# Patient Record
Sex: Female | Born: 1995 | Race: Black or African American | Hispanic: No | Marital: Single | Smoking: Never smoker
Health system: Southern US, Community
[De-identification: ages and names within clinical notes are randomized; demographics above are authoritative.]

## PROBLEM LIST (undated history)

## (undated) DIAGNOSIS — N390 Urinary tract infection, site not specified: Secondary | ICD-10-CM

## (undated) DIAGNOSIS — B9689 Other specified bacterial agents as the cause of diseases classified elsewhere: Secondary | ICD-10-CM

## (undated) DIAGNOSIS — B379 Candidiasis, unspecified: Secondary | ICD-10-CM

## (undated) HISTORY — PX: LYMPH NODE DISSECTION: SHX5087

## (undated) HISTORY — PX: BREAST SURGERY: SHX581

---

## 2016-01-08 ENCOUNTER — Emergency Department (HOSPITAL_COMMUNITY): Payer: Medicaid Other

## 2016-01-08 ENCOUNTER — Encounter (HOSPITAL_COMMUNITY): Payer: Self-pay

## 2016-01-08 ENCOUNTER — Emergency Department (HOSPITAL_COMMUNITY)
Admission: EM | Admit: 2016-01-08 | Discharge: 2016-01-08 | Disposition: A | Discharge status: Home / Self Care | Payer: Medicaid Other | Attending: Emergency Medicine | Admitting: Emergency Medicine

## 2016-01-08 DIAGNOSIS — R079 Chest pain, unspecified: Secondary | ICD-10-CM | POA: Insufficient documentation

## 2016-01-08 DIAGNOSIS — R002 Palpitations: Secondary | ICD-10-CM | POA: Insufficient documentation

## 2016-01-08 LAB — CBC
HCT: 39.7 % (ref 36.0–46.0)
HEMOGLOBIN: 13.2 g/dL (ref 12.0–15.0)
MCH: 26.7 pg (ref 26.0–34.0)
MCHC: 33.2 g/dL (ref 30.0–36.0)
MCV: 80.2 fL (ref 78.0–100.0)
Platelets: 300 10*3/uL (ref 150–400)
RBC: 4.95 MIL/uL (ref 3.87–5.11)
RDW: 13.8 % (ref 11.5–15.5)
WBC: 7.6 10*3/uL (ref 4.0–10.5)

## 2016-01-08 LAB — I-STAT TROPONIN, ED: TROPONIN I, POC: 0 ng/mL (ref 0.00–0.08)

## 2016-01-08 LAB — BASIC METABOLIC PANEL
ANION GAP: 14 (ref 5–15)
BUN: 12 mg/dL (ref 6–20)
CALCIUM: 9.5 mg/dL (ref 8.9–10.3)
CO2: 23 mmol/L (ref 22–32)
Chloride: 100 mmol/L — ABNORMAL LOW (ref 101–111)
Creatinine, Ser: 0.92 mg/dL (ref 0.44–1.00)
GFR calc Af Amer: >60 mL/min (ref >60)
GLUCOSE: 113 mg/dL — AB (ref 65–99)
Potassium: 3.9 mmol/L (ref 3.5–5.1)
SODIUM: 137 mmol/L (ref 135–145)

## 2016-01-08 LAB — MAGNESIUM: Magnesium: 2 mg/dL (ref 1.7–2.4)

## 2016-01-08 NOTE — ED Notes (Signed)
PER PTAR: pt brought in via ems for chest pain and heart palpitations. She felt as though her heart was beating very fast. HR was 112 with EMS. BP- 150/70. CP started this morning around 12am and had one episode of emesis.

## 2016-01-08 NOTE — ED Provider Notes (Signed)
CSN: [id number]     Arrival date & time 01/08/16  0114 History   First MD Initiated Contact with Patient 01/08/16 0415     Chief Complaint  Patient presents with  . Chest Pain     (Consider location/radiation/quality/duration/timing/severity/associated sxs/prior Treatment) HPI Joyce Hernandez is a 20 y.o. female with no significant past medical history presenting today for chest pain. She states she was studying this morning around midnight. She is drinking coffee. She had sudden onset of chest pain and palpitations. She states she fell is beating very fast. She took a shower and when EMS arrived they state that there is nothing makes her due for her. She then went to bed but he came back in the middle the night. She then called EMS and she was transported here. She denies his having in the past. She denies excessive caffeine use. She had no chest shortness of breath, vomiting, or sweating at the time. Her symptoms have all resolved and she is in the waiting room. There are no further complaints.  10 Systems reviewed and are negative for acute change except as noted in the HPI.     History reviewed. No pertinent past medical history. Past Surgical History  Procedure Laterality Date  . Breast surgery      lymph node removal   No family history on file. Social History  Substance Use Topics  . Smoking status: Never Smoker   . Smokeless tobacco: None  . Alcohol Use: No   OB History    No data available     Review of Systems    Allergies  Review of patient's allergies indicates not on file.  Home Medications   Prior to Admission medications   Not on File   BP 131/81 mmHg  Pulse 94  Temp(Src) 99 F (37.2 C) (Oral)  Resp 16  SpO2 100%  LMP 01/08/2016 Physical Exam  Constitutional: She is oriented to person, place, and time. She appears well-developed and well-nourished. No distress.  HENT:  Head: Normocephalic and atraumatic.  Nose: Nose normal.  Mouth/Throat:  Oropharynx is clear and moist. No oropharyngeal exudate.  Eyes: Conjunctivae and EOM are normal. Pupils are equal, round, and reactive to light. No scleral icterus.  Neck: Normal range of motion. Neck supple. No JVD present. No tracheal deviation present. No thyromegaly present.  Cardiovascular: Normal rate, regular rhythm and normal heart sounds.  Exam reveals no gallop and no friction rub.   No murmur heard. Pulmonary/Chest: Effort normal and breath sounds normal. No respiratory distress. She has no wheezes. She exhibits no tenderness.  Abdominal: Soft. Bowel sounds are normal. She exhibits no distension and no mass. There is no tenderness. There is no rebound and no guarding.  Musculoskeletal: Normal range of motion. She exhibits no edema or tenderness.  Lymphadenopathy:    She has no cervical adenopathy.  Neurological: She is alert and oriented to person, place, and time. No cranial nerve deficit. She exhibits normal muscle tone.  Skin: Skin is warm and dry. No rash noted. No erythema. No pallor.  Nursing note and vitals reviewed.   ED Course  Procedures (including critical care time) Labs Review Labs Reviewed  BASIC METABOLIC PANEL - Abnormal; Notable for the following:    Chloride 100 (*)    Glucose, Bld 113 (*)    All other components within normal limits  CBC  MAGNESIUM  I-STAT TROPOININ, ED    Imaging Review Dg Chest 2 View  01/08/2016  CLINICAL DATA:  Left-sided  chest burning and increased heart rate tonight. Nonsmoker. EXAM: CHEST  2 VIEW COMPARISON:  None. FINDINGS: The heart size and mediastinal contours are within normal limits. Both lungs are clear. The visualized skeletal structures are unremarkable. IMPRESSION: No active cardiopulmonary disease. Electronically Signed   By: Burman NievesWilliam  Stevens M.D.   On: 01/08/2016 02:33   I have personally reviewed and evaluated these images and lab results as part of my medical decision-making.   EKG Interpretation   Date/Time:   Friday January 08 2016 01:28:31 EDT Ventricular Rate:  92 PR Interval:  138 QRS Duration: 80 QT Interval:  344 QTC Calculation: 425 R Axis:   82 Text Interpretation:  Normal sinus rhythm with sinus arrhythmia Normal ECG  No old tracing to compare Confirmed by Erroll Lunani, Jevan Gaunt Ayokunle [phone]([id number]) on  01/08/2016 4:17:36 AM      MDM   Final diagnoses:  None    Patient presents to the emergency department for palpitations. Her symptoms have all resolved. EKG is normal. Troponin was not clinically indicated but obtained by triage and is negative. She currently has no further symptoms. We'll excise are within normal limits. She appears well in no acute distress, vital signs remain within her normal limits and she is safe for discharge.    Tomasita CrumbleAdeleke Billal Rollo, MD 01/08/16 [phone]

## 2016-01-08 NOTE — Discharge Instructions (Signed)
Palpitations Joyce Hernandez, her blood work, EKG, and chest x-ray of your heart were all normal. See a primary care physician within 3 days for close follow-up. Do not use excessive caffeine to prevent further palpitations. For any worsening symptoms, come back to emergency department immediately. Thank you. A palpitation is the feeling that your heartbeat is irregular. It may feel like your heart is fluttering or skipping a beat. It may also feel like your heart is beating faster than normal. This is usually not a serious problem. In some cases, you may need more medical tests. HOME CARE  Avoid:  Caffeine in coffee, tea, soft drinks, diet pills, and energy drinks.  Chocolate.  Alcohol.  Stop smoking if you smoke.  Reduce your stress and anxiety. Try:  A method that measures bodily functions so you can learn to control them (biofeedback).  Yoga.  Meditation.  Physical activity such as swimming, jogging, or walking.  Get plenty of rest and sleep. GET HELP IF:  Your fast or irregular heartbeat continues after 24 hours.  Your palpitations occur more often. GET HELP RIGHT AWAY IF:   You have chest pain.  You feel short of breath.  You have a very bad headache.  You feel dizzy or pass out (faint). MAKE SURE YOU:   Understand these instructions.  Will watch your condition.  Will get help right away if you are not doing well or get worse.   This information is not intended to replace advice given to you by your health care provider. Make sure you discuss any questions you have with your health care provider.   Document Released: 06/14/2008 Document Revised: 09/26/2014 Document Reviewed: 11/04/2011 Elsevier Interactive Patient Education [employer]2016 Elsevier Inc.

## 2016-01-19 ENCOUNTER — Encounter (HOSPITAL_COMMUNITY): Payer: Self-pay | Admitting: Emergency Medicine

## 2016-01-19 ENCOUNTER — Emergency Department (HOSPITAL_COMMUNITY)
Admission: EM | Admit: 2016-01-19 | Discharge: 2016-01-19 | Disposition: A | Discharge status: Home / Self Care | Payer: Medicaid Other | Attending: Emergency Medicine | Admitting: Emergency Medicine

## 2016-01-19 DIAGNOSIS — Z3202 Encounter for pregnancy test, result negative: Secondary | ICD-10-CM | POA: Diagnosis not present

## 2016-01-19 DIAGNOSIS — N939 Abnormal uterine and vaginal bleeding, unspecified: Secondary | ICD-10-CM

## 2016-01-19 LAB — CBC WITH DIFFERENTIAL/PLATELET
BASOS ABS: 0.1 10*3/uL (ref 0.0–0.1)
BASOS PCT: 1 %
EOS ABS: 0.2 10*3/uL (ref 0.0–0.7)
EOS PCT: 2 %
HCT: 42.8 % (ref 36.0–46.0)
Hemoglobin: 14.3 g/dL (ref 12.0–15.0)
LYMPHS PCT: 41 %
Lymphs Abs: 2.9 10*3/uL (ref 0.7–4.0)
MCH: 26.8 pg (ref 26.0–34.0)
MCHC: 33.4 g/dL (ref 30.0–36.0)
MCV: 80.3 fL (ref 78.0–100.0)
Monocytes Absolute: 0.5 10*3/uL (ref 0.1–1.0)
Monocytes Relative: 7 %
Neutro Abs: 3.5 10*3/uL (ref 1.7–7.7)
Neutrophils Relative %: 49 %
PLATELETS: 386 10*3/uL (ref 150–400)
RBC: 5.33 MIL/uL — AB (ref 3.87–5.11)
RDW: 14.1 % (ref 11.5–15.5)
WBC: 7 10*3/uL (ref 4.0–10.5)

## 2016-01-19 LAB — WET PREP, GENITAL
CLUE CELLS WET PREP: NONE SEEN
SPERM: NONE SEEN
Trich, Wet Prep: NONE SEEN
Yeast Wet Prep HPF POC: NONE SEEN

## 2016-01-19 LAB — BASIC METABOLIC PANEL
ANION GAP: 9 (ref 5–15)
BUN: 13 mg/dL (ref 6–20)
CALCIUM: 9.6 mg/dL (ref 8.9–10.3)
CO2: 27 mmol/L (ref 22–32)
Chloride: 106 mmol/L (ref 101–111)
Creatinine, Ser: 0.82 mg/dL (ref 0.44–1.00)
GFR calc Af Amer: >60 mL/min (ref >60)
Glucose, Bld: 77 mg/dL (ref 65–99)
POTASSIUM: 3.8 mmol/L (ref 3.5–5.1)
SODIUM: 142 mmol/L (ref 135–145)

## 2016-01-19 LAB — POC URINE PREG, ED: PREG TEST UR: NEGATIVE

## 2016-01-19 NOTE — Discharge Instructions (Signed)
Your labs today were unremarkable. Please call [facility] to schedule an appointment for further evaluation. Return to the ER for new or worsening symptoms.   Abnormal Uterine Bleeding Abnormal uterine bleeding can affect women at various stages in life, including teenagers, women in their reproductive years, pregnant women, and women who have reached menopause. Several kinds of uterine bleeding are considered abnormal, including:  Bleeding or spotting between periods.   Bleeding after sexual intercourse.   Bleeding that is heavier or more than normal.   Periods that last longer than usual.  Bleeding after menopause.  Many cases of abnormal uterine bleeding are minor and simple to treat, while others are more serious. Any type of abnormal bleeding should be evaluated by your health care provider. Treatment will depend on the cause of the bleeding. HOME CARE INSTRUCTIONS Monitor your condition for any changes. The following actions may help to alleviate any discomfort you are experiencing:  Avoid the use of tampons and douches as directed by your health care provider.  Change your pads frequently. You should get regular pelvic exams and Pap tests. Keep all follow-up appointments for diagnostic tests as directed by your health care provider.  SEEK MEDICAL CARE IF:   Your bleeding lasts more than 1 week.   You feel dizzy at times.  SEEK IMMEDIATE MEDICAL CARE IF:   You pass out.   You are changing pads every 15 to 30 minutes.   You have abdominal pain.  You have a fever.   You become sweaty or weak.   You are passing large blood clots from the vagina.   You start to feel nauseous and vomit. MAKE SURE YOU:   Understand these instructions.  Will watch your condition.  Will get help right away if you are not doing well or get worse.   This information is not intended to replace advice given to you by your health care provider. Make sure you  discuss any questions you have with your health care provider.   Document Released: 09/05/2005 Document Revised: 09/10/2013 Document Reviewed: 04/04/2013 Elsevier Interactive Patient Education 2016 Elsevier Inc.  Dysfunctional Uterine Bleeding Dysfunctional uterine bleeding is abnormal bleeding from the uterus. Dysfunctional uterine bleeding includes:  A period that comes earlier or later than usual.  A period that is lighter, heavier, or has blood clots.  Bleeding between periods.  Skipping one or more periods.  Bleeding after sexual intercourse.  Bleeding after menopause. HOME CARE INSTRUCTIONS  Pay attention to any changes in your symptoms. Follow these instructions to help with your condition: Eating  Eat well-balanced meals. Include foods that are high in iron, such as liver, meat, shellfish, green leafy vegetables, and eggs.  If you become constipated:  Drink plenty of water.  Eat fruits and vegetables that are high in water and fiber, such as spinach, carrots, raspberries, apples, and mango. Medicines  Take over-the-counter and prescription medicines only as told by your health care provider.  Do not change medicines without talking with your health care provider.  Aspirin or medicines that contain aspirin may make the bleeding worse. Do not take those medicines:  During the week before your period.  During your period.  If you were prescribed iron pills, take them as told by your health care provider. Iron pills help to replace iron that your body loses because of this condition. Activity  If you need to change your sanitary pad or tampon more than one time every 2 hours:  Lie in bed  with your feet raised (elevated).  Place a cold pack on your lower abdomen.  Rest as much as possible until the bleeding stops or slows down.  Do not try to lose weight until the bleeding has stopped and your blood iron level is back to normal. Other Instructions  For two  months, write down:  When your period starts.  When your period ends.  When any abnormal bleeding occurs.  What problems you notice.  Keep all follow up visits as told by your health care provider. This is important. SEEK MEDICAL CARE IF:  You get light-headed or weak.  You have nausea and vomiting.  You cannot eat or drink without vomiting.  You feel dizzy or have diarrhea while you are taking medicines.  You are taking birth control pills or hormones, and you want to change them or stop taking them. SEEK IMMEDIATE MEDICAL CARE IF:  You develop a fever or chills.  You need to change your sanitary pad or tampon more than one time per hour.  Your bleeding becomes heavier, or your flow contains clots more often.  You develop pain in your abdomen.  You lose consciousness.  You develop a rash.   This information is not intended to replace advice given to you by your health care provider. Make sure you discuss any questions you have with your health care provider.   Document Released: 09/02/2000 Document Revised: 05/27/2015 Document Reviewed: 12/01/2014 Elsevier Interactive Patient Education [employer]2016 Elsevier Inc.

## 2016-01-19 NOTE — ED Notes (Signed)
PA at bedside.

## 2016-01-19 NOTE — Progress Notes (Signed)
Medicaid [place] access response hx indicates [facility] [address] [place], [place] [id number] [phone]

## 2016-01-19 NOTE — ED Provider Notes (Signed)
CSN: [id number]     Arrival date & time 01/19/16  1552 History   First MD Initiated Contact with Patient 01/19/16 1647     Chief Complaint  Patient presents with  . Vaginal Bleeding    HPI  Joyce Hernandez is an 20 y.o. female with no significant PMH who presents to the ED for evaluation of vaginal bleeding. She states she got her period on time at the beginning of April. She states that since then she has continued to have daily bleeding filling two maxi pads a day. She denies abdominal pain, n/v/d, dysuria, urinary frequency/urgency, or other vaginal discharge. She states that she had her last depo shot in August 2016 and has not been on any birth control since then. She states that until this past month her periods were regular and not heavy. She denies feeling faint or lightheaded. She does not have an OBGYN.   History reviewed. No pertinent past medical history. Past Surgical History  Procedure Laterality Date  . Breast surgery      lymph node removal   History reviewed. No pertinent family history. Social History  Substance Use Topics  . Smoking status: Never Smoker   . Smokeless tobacco: None  . Alcohol Use: No   OB History    No data available     Review of Systems  All other systems reviewed and are negative.     Allergies  Review of patient's allergies indicates no known allergies.  Home Medications   Prior to Admission medications   Medication Sig Start Date End Date Taking? Authorizing Provider  acetaminophen (TYLENOL) 500 MG tablet Take 500 mg by mouth 2 (two) times daily as needed for moderate pain or headache.   Yes Historical Provider, MD  Pseudoeph-Doxylamine-DM-APAP (NYQUIL PO) Take 1 Dose by mouth daily as needed (cold symptoms).   Yes Historical Provider, MD   BP 131/79 mmHg  Pulse 100  Temp(Src) 98.8 F (37.1 C) (Oral)  Resp 18  SpO2 100%  LMP 01/08/2016 Physical Exam  Constitutional: She is oriented to person, place, and time.  HENT:  Right  Ear: External ear normal.  Left Ear: External ear normal.  Nose: Nose normal.  Mouth/Throat: Oropharynx is clear and moist. No oropharyngeal exudate.  Eyes: Conjunctivae and EOM are normal. Pupils are equal, round, and reactive to light.  Neck: Normal range of motion. Neck supple.  Cardiovascular: Normal rate, regular rhythm, normal heart sounds and intact distal pulses.   Pulmonary/Chest: Effort normal and breath sounds normal. No respiratory distress. She has no wheezes. She exhibits no tenderness.  Abdominal: Soft. Bowel sounds are normal. She exhibits no distension. There is no tenderness.  Genitourinary:  Scant blood in vault. No masses. Cervix unremarkable. No CMT or adnexal tenderness.   Musculoskeletal: She exhibits no edema.  Neurological: She is alert and oriented to person, place, and time. No cranial nerve deficit.  Skin: Skin is warm and dry.  Psychiatric: She has a normal mood and affect.  Nursing note and vitals reviewed.  Filed Vitals:   01/19/16 1559 01/19/16 1810  BP: 131/79 107/70  Pulse: 100 64  Temp: 98.8 F (37.1 C)   TempSrc: Oral   Resp: 18 16  SpO2: 100% 100%     ED Course  Procedures (including critical care time) Labs Review Labs Reviewed  WET PREP, GENITAL - Abnormal; Notable for the following:    WBC, Wet Prep HPF POC MODERATE (*)    All other components within normal limits  CBC WITH DIFFERENTIAL/PLATELET - Abnormal; Notable for the following:    RBC 5.33 (*)    All other components within normal limits  BASIC METABOLIC PANEL  POC URINE PREG, ED  GC/CHLAMYDIA PROBE AMP ([place]) NOT AT [facility]    Imaging Review No results found. [facility] personally reviewed and evaluated these images and lab results as part of my medical decision-making.   EKG Interpretation None      MDM   Final diagnoses:  Abnormal uterine bleeding    Labs unremarkable. No anemia. Hemodynamically stable. No heavy bleeding on GU exam. No indication for further  emergent workup at this time. Discussed with pt there are many possible etiologies to her abnormal uterine bleeding. Instructed f/u at [facility] outpatient clinic. ER return precautions given.    Carlene Coria, PA-C 01/19/16 1830  Richardean Canal, MD 01/19/16 2225

## 2016-01-19 NOTE — ED Notes (Signed)
Pt states that she has been vaginally bleeding x 1 month. States that she had her depo shot last August but not since then. Alert and oriented.

## 2016-01-20 LAB — GC/CHLAMYDIA PROBE AMP (~~LOC~~) NOT AT ARMC
Chlamydia: NEGATIVE
Neisseria Gonorrhea: NEGATIVE

## 2016-09-25 ENCOUNTER — Ambulatory Visit (HOSPITAL_COMMUNITY)
Admission: EM | Admit: 2016-09-25 | Discharge: 2016-09-25 | Disposition: A | Discharge status: Home / Self Care | Payer: Medicaid Other | Attending: Family Medicine | Admitting: Family Medicine

## 2016-09-25 ENCOUNTER — Encounter (HOSPITAL_COMMUNITY): Payer: Self-pay | Admitting: Emergency Medicine

## 2016-09-25 DIAGNOSIS — J019 Acute sinusitis, unspecified: Secondary | ICD-10-CM | POA: Diagnosis not present

## 2016-09-25 MED ORDER — AMOXICILLIN 500 MG PO CAPS: 500.0000 mg | ORAL_CAPSULE | Freq: Three times a day (TID) | ORAL | 0 refills | Status: DC

## 2016-09-25 NOTE — ED Provider Notes (Signed)
CSN: [id number]     Arrival date & time 09/25/16  1351 History   First MD Initiated Contact with Patient 09/25/16 1456     Chief Complaint  Patient presents with  . URI   (Consider location/radiation/quality/duration/timing/severity/associated sxs/prior Treatment) HPI Joyce Hernandez is a 21 y.o. female presenting to UC with c/o subjective fever, generalized and frontal headaches, sore throat, nasal congestion, cough, and bilateral ear pain that started 5 days ago.  She has taken OTC cough/cold medication with mild temporary relief.  Denies sick contacts or recent travel. Denies n/v/d.    History reviewed. No pertinent past medical history. Past Surgical History:  Procedure Laterality Date  . BREAST SURGERY     lymph node removal   No family history on file. Social History  Substance Use Topics  . Smoking status: Never Smoker  . Smokeless tobacco: Never Used  . Alcohol use No   OB History    No data available     Review of Systems  Constitutional: Positive for fever. Negative for chills.  HENT: Positive for congestion, ear pain, rhinorrhea and sore throat. Negative for trouble swallowing and voice change.   Respiratory: Positive for cough. Negative for shortness of breath.   Cardiovascular: Negative for chest pain and palpitations.  Gastrointestinal: Negative for abdominal pain, diarrhea, nausea and vomiting.  Musculoskeletal: Positive for arthralgias and myalgias. Negative for back pain.  Skin: Negative for rash.  Neurological: Positive for headaches. Negative for dizziness and light-headedness.    Allergies  Patient has no known allergies.  Home Medications   Prior to Admission medications   Medication Sig Start Date End Date Taking? Authorizing Provider  acetaminophen (TYLENOL) 500 MG tablet Take 500 mg by mouth 2 (two) times daily as needed for moderate pain or headache.    Historical Provider, MD  amoxicillin (AMOXIL) 500 MG capsule Take 1 capsule (500 mg total)  by mouth 3 (three) times daily. 09/25/16   Junius FinnerErin O'Malley, PA-C  Pseudoeph-Doxylamine-DM-APAP (NYQUIL PO) Take 1 Dose by mouth daily as needed (cold symptoms).    Historical Provider, MD   Meds Ordered and Administered this Visit  Medications - No data to display  BP 140/85 (BP Location: Right Arm)   Pulse 97   Temp 99.6 F (37.6 C) (Oral)   Resp 16   Ht 5' (1.524 m)   Wt 150 lb (68 kg)   SpO2 97%   BMI 29.29 kg/m  No data found.   Physical Exam  Constitutional: She is oriented to person, place, and time. She appears well-developed and well-nourished. No distress.  HENT:  Head: Normocephalic and atraumatic.  Right Ear: Tympanic membrane normal.  Left Ear: Tympanic membrane normal.  Nose: Mucosal edema present. Right sinus exhibits maxillary sinus tenderness and frontal sinus tenderness. Left sinus exhibits maxillary sinus tenderness and frontal sinus tenderness.  Mouth/Throat: Uvula is midline and mucous membranes are normal. Posterior oropharyngeal erythema present. No oropharyngeal exudate, posterior oropharyngeal edema or tonsillar abscesses.  Eyes: EOM are normal.  Neck: Normal range of motion. Neck supple.  Cardiovascular: Normal rate and regular rhythm.   Pulmonary/Chest: Effort normal and breath sounds normal. No stridor. No respiratory distress. She has no wheezes. She has no rales.  Musculoskeletal: Normal range of motion.  Lymphadenopathy:    She has no cervical adenopathy.  Neurological: She is alert and oriented to person, place, and time.  Skin: Skin is warm and dry. She is not diaphoretic.  Psychiatric: She has a normal mood and affect. Her behavior  is normal.  Nursing note and vitals reviewed.   Urgent Care Course   Clinical Course     Procedures (including critical care time)  Labs Review Labs Reviewed - No data to display  Imaging Review No results found.    MDM   1. Acute rhinosinusitis    Pt c/o 5 days of URI and sinus symptoms, gradually  worsening. Fever and sinus pain. Will cover for bacterial cause given sinus tenderness and reported persistent fever. Rx: Amoxicillin  F/u with PCP in 1 week if not improving.    Junius Finner, PA-C 09/25/16 1805

## 2016-09-25 NOTE — ED Triage Notes (Signed)
PT reports fever, headaches, body aches., sore throat, cough, and ear pain that started Wednesday

## 2017-05-24 ENCOUNTER — Encounter (HOSPITAL_COMMUNITY): Payer: Self-pay | Admitting: Emergency Medicine

## 2017-05-24 DIAGNOSIS — R109 Unspecified abdominal pain: Secondary | ICD-10-CM | POA: Diagnosis not present

## 2017-05-24 DIAGNOSIS — R51 Headache: Secondary | ICD-10-CM | POA: Insufficient documentation

## 2017-05-24 DIAGNOSIS — R42 Dizziness and giddiness: Secondary | ICD-10-CM | POA: Insufficient documentation

## 2017-05-24 DIAGNOSIS — Z5321 Procedure and treatment not carried out due to patient leaving prior to being seen by health care provider: Secondary | ICD-10-CM | POA: Diagnosis not present

## 2017-05-24 LAB — URINALYSIS, ROUTINE W REFLEX MICROSCOPIC
BILIRUBIN URINE: NEGATIVE
Glucose, UA: NEGATIVE mg/dL
HGB URINE DIPSTICK: NEGATIVE
Ketones, ur: NEGATIVE mg/dL
Leukocytes, UA: NEGATIVE
Nitrite: NEGATIVE
PH: 5 (ref 5.0–8.0)
Protein, ur: NEGATIVE mg/dL
SPECIFIC GRAVITY, URINE: 1.024 (ref 1.005–1.030)

## 2017-05-24 LAB — BASIC METABOLIC PANEL
ANION GAP: 8 (ref 5–15)
BUN: 11 mg/dL (ref 6–20)
CHLORIDE: 105 mmol/L (ref 101–111)
CO2: 25 mmol/L (ref 22–32)
Calcium: 9.6 mg/dL (ref 8.9–10.3)
Creatinine, Ser: 0.84 mg/dL (ref 0.44–1.00)
GFR calc Af Amer: >60 mL/min (ref >60)
GFR calc non Af Amer: >60 mL/min (ref >60)
GLUCOSE: 86 mg/dL (ref 65–99)
POTASSIUM: 4.2 mmol/L (ref 3.5–5.1)
Sodium: 138 mmol/L (ref 135–145)

## 2017-05-24 LAB — CBC
HEMATOCRIT: 40.6 % (ref 36.0–46.0)
HEMOGLOBIN: 13.5 g/dL (ref 12.0–15.0)
MCH: 26.7 pg (ref 26.0–34.0)
MCHC: 33.3 g/dL (ref 30.0–36.0)
MCV: 80.2 fL (ref 78.0–100.0)
Platelets: 335 10*3/uL (ref 150–400)
RBC: 5.06 MIL/uL (ref 3.87–5.11)
RDW: 13.6 % (ref 11.5–15.5)
WBC: 7.7 10*3/uL (ref 4.0–10.5)

## 2017-05-24 LAB — I-STAT BETA HCG BLOOD, ED (MC, WL, AP ONLY): I-stat hCG, quantitative: <5 m[IU]/mL (ref <5)

## 2017-05-24 LAB — CBG MONITORING, ED: Glucose-Capillary: 62 mg/dL — ABNORMAL LOW (ref 65–99)

## 2017-05-24 NOTE — ED Notes (Addendum)
Pt has multiple complaints, reports dizziness that started while at work. Also reports abd pain and HA a few days ago.

## 2017-05-24 NOTE — ED Notes (Signed)
Pt states she is leaving ED due to the 7hr wait time.

## 2017-05-25 ENCOUNTER — Emergency Department (HOSPITAL_COMMUNITY)
Admission: EM | Admit: 2017-05-25 | Discharge: 2017-05-25 | Disposition: A | Discharge status: Home / Self Care | Payer: Medicaid Other | Attending: Emergency Medicine | Admitting: Emergency Medicine

## 2017-08-30 ENCOUNTER — Other Ambulatory Visit: Payer: Self-pay

## 2017-08-30 ENCOUNTER — Ambulatory Visit (HOSPITAL_COMMUNITY)
Admission: EM | Admit: 2017-08-30 | Discharge: 2017-08-30 | Disposition: A | Discharge status: Home / Self Care | Payer: Medicaid Other | Attending: Family Medicine | Admitting: Family Medicine

## 2017-08-30 ENCOUNTER — Encounter (HOSPITAL_COMMUNITY): Payer: Self-pay | Admitting: Emergency Medicine

## 2017-08-30 DIAGNOSIS — N760 Acute vaginitis: Secondary | ICD-10-CM | POA: Insufficient documentation

## 2017-08-30 DIAGNOSIS — N390 Urinary tract infection, site not specified: Secondary | ICD-10-CM | POA: Diagnosis present

## 2017-08-30 LAB — POCT PREGNANCY, URINE: PREG TEST UR: NEGATIVE

## 2017-08-30 MED ORDER — METRONIDAZOLE 500 MG PO TABS: 500.0000 mg | ORAL_TABLET | Freq: Two times a day (BID) | ORAL | 0 refills | Status: AC

## 2017-08-30 NOTE — Discharge Instructions (Addendum)
Complete course of medication for bacterial vaginosis, we will notify you of any positive findings from your vaginal test.  Withhold from intercourse until medication complete.  If symptoms worsen or do not improve in the next week to return to be seen or to follow up with PCP.

## 2017-08-30 NOTE — ED Provider Notes (Addendum)
[facility]    CSN: [id number] Arrival date & time: 08/30/17  1008     History   Chief Complaint Chief Complaint  Patient presents with  . Urinary Tract Infection    HPI Joyce Hernandez is a 21 y.o. female.   Joyce SohoZhanaysha presents with complaints of malodorous vaginal discharge which she has had for approximately 1 week. She states it is white discharge. Denies any vaginal itching. She states she was treated for UTI, 11/23- 11/30. She states all urinary symptoms have since resolved, denies dysuria, frequency or urgency. LMP 12/3. She is sexually active with one partner and does use condom. Denies known exposure to STDs. Denies abdominal pain, back pain, fevers, or previous similar symptoms.    ROS per HPI.       History reviewed. No pertinent past medical history.  There are no active problems to display for this patient.   Past Surgical History:  Procedure Laterality Date  . BREAST SURGERY     lymph node removal    OB History    No data available       Home Medications    Prior to Admission medications   Medication Sig Start Date End Date Taking? Authorizing Provider  acetaminophen (TYLENOL) 500 MG tablet Take 500 mg by mouth 2 (two) times daily as needed for moderate pain or headache.    [provider]  metroNIDAZOLE (FLAGYL) 500 MG tablet Take 1 tablet (500 mg total) by mouth 2 (two) times daily for 7 days. 08/30/17 09/06/17  Georgetta HaberBurky, Natalie B, NP    Family History Family History  Problem Relation Age of Onset  . Healthy Mother     Social History Social History   Tobacco Use  . Smoking status: Never Smoker  . Smokeless tobacco: Never Used  Substance Use Topics  . Alcohol use: No  . Drug use: No     Allergies   Patient has no known allergies.   Review of Systems Review of Systems   Physical Exam Triage Vital Signs ED Triage Vitals [08/30/17 1049]  Enc Vitals Group     BP 115/73     Pulse Rate 74     Resp 18    Temp 98.9 F (37.2 C)     Temp Source Oral     SpO2 100 %     Weight      Height      Head Circumference      Peak Flow      Pain Score      Pain Loc      Pain Edu?      Excl. in GC?    No data found.  Updated Vital Signs BP 115/73 (BP Location: Right Arm)   Pulse 74   Temp 98.9 F (37.2 C) (Oral)   Resp 18   LMP 08/21/2017   SpO2 100%   Visual Acuity Right Eye Distance:   Left Eye Distance:   Bilateral Distance:    Right Eye Near:   Left Eye Near:    Bilateral Near:     Physical Exam  Constitutional: She is oriented to person, place, and time. She appears well-developed and well-nourished. No distress.  Cardiovascular: Normal rate, regular rhythm and normal heart sounds.  Pulmonary/Chest: Effort normal and breath sounds normal.  Abdominal: Soft. There is no tenderness. There is no CVA tenderness.  Genitourinary:  Genitourinary Comments: Pelvic exam deferred at this time, patient self- collected vaginal swab   Neurological: She is alert  and oriented to person, place, and time.  Skin: Skin is warm and dry.     UC Treatments / Results  Labs (all labs ordered are listed, but only abnormal results are displayed) Labs Reviewed  CERVICOVAGINAL ANCILLARY ONLY    EKG  EKG Interpretation None       Radiology No results found.  Procedures Procedures (including critical care time)  Medications Ordered in UC Medications - No data to display   Initial Impression / Assessment and Plan / UC Course  I have reviewed the triage vital signs and the nursing notes.  Pertinent labs & imaging results that were available during my care of the patient were reviewed by me and considered in my medical decision making (see chart for details).     Patient unable to provide a clean catch UA today, symptoms inconsistent with UTI, to return to clinic if develop urinary symptoms. Vaginal symptoms most consistent with BV, flagyl initiated, std screening also performed. Will  notify of any positive findings and if any changes to treatment are needed.  If symptoms worsen or do not improve in the next week to return to be seen or to follow up with PCP.  Patient verbalized understanding and agreeable to plan.    Final Clinical Impressions(s) / UC Diagnoses   Final diagnoses:  Acute vaginitis    ED Discharge Orders        Ordered    metroNIDAZOLE (FLAGYL) 500 MG tablet  2 times daily     08/30/17 1120       Controlled Substance Prescriptions [place] Controlled Substance Registry consulted? Not Applicable   Georgetta HaberBurky, Natalie B, NP 08/30/17 1128    Linus MakoBurky, Natalie B, NP 08/30/17 [phone]

## 2017-08-30 NOTE — ED Notes (Signed)
Given instructions for collecting a dirty and clean urine.   Patient requested water to help provide specimens, given ice water

## 2017-08-30 NOTE — ED Triage Notes (Signed)
11/23 seen at an urgent care and told she had a uti.    Now she complains of a discharge with odor.   Denies painful urination, or increase in frequency

## 2017-08-31 LAB — CERVICOVAGINAL ANCILLARY ONLY
BACTERIAL VAGINITIS: POSITIVE — AB
CANDIDA VAGINITIS: NEGATIVE
CHLAMYDIA, DNA PROBE: NEGATIVE
NEISSERIA GONORRHEA: NEGATIVE
Trichomonas: NEGATIVE

## 2017-10-02 ENCOUNTER — Ambulatory Visit: Payer: Medicaid Other | Admitting: Obstetrics and Gynecology

## 2017-10-03 ENCOUNTER — Ambulatory Visit (HOSPITAL_COMMUNITY)
Admission: EM | Admit: 2017-10-03 | Discharge: 2017-10-03 | Disposition: A | Discharge status: Home / Self Care | Payer: Medicaid Other | Attending: Family Medicine | Admitting: Family Medicine

## 2017-10-03 ENCOUNTER — Encounter (HOSPITAL_COMMUNITY): Payer: Self-pay | Admitting: Family Medicine

## 2017-10-03 DIAGNOSIS — L03116 Cellulitis of left lower limb: Secondary | ICD-10-CM

## 2017-10-03 DIAGNOSIS — L03119 Cellulitis of unspecified part of limb: Secondary | ICD-10-CM

## 2017-10-03 DIAGNOSIS — W57XXXA Bitten or stung by nonvenomous insect and other nonvenomous arthropods, initial encounter: Secondary | ICD-10-CM

## 2017-10-03 MED ORDER — MUPIROCIN CALCIUM 2 % EX CREA: 1.0000 "application " | TOPICAL_CREAM | Freq: Two times a day (BID) | CUTANEOUS | 0 refills | Status: AC

## 2017-10-03 NOTE — ED Provider Notes (Signed)
[facility]    CSN: [id number] Arrival date & time: 10/03/17  1213     History   Chief Complaint Chief Complaint  Patient presents with  . Abscess    HPI Joyce Hernandez is a 22 y.o. female.   Healthy 22 y.o. Female, presents today for rash on her lower extremities onset 2 days ago. Patient is concern for infection. Denies fever. Denies insect bite. Denies drainage, itchiness, swelling or streaking. [facility] given doxycycline 100 mg BID x 10 days which she doesn't like and would prefer topical cream instead.       History reviewed. No pertinent past medical history.  There are no active problems to display for this patient.   Past Surgical History:  Procedure Laterality Date  . BREAST SURGERY     lymph node removal    OB History    No data available       Home Medications    Prior to Admission medications   Medication Sig Start Date End Date Taking? Authorizing Provider  acetaminophen (TYLENOL) 500 MG tablet Take 500 mg by mouth 2 (two) times daily as needed for moderate pain or headache.    [provider]  mupirocin cream (BACTROBAN) 2 % Apply 1 application topically 2 (two) times daily for 7 days. 10/03/17 10/10/17  Lucia EstelleZheng, Brendalee Matthies, NP    Family History Family History  Problem Relation Age of Onset  . Healthy Mother     Social History Social History   Tobacco Use  . Smoking status: Never Smoker  . Smokeless tobacco: Never Used  Substance Use Topics  . Alcohol use: No  . Drug use: No     Allergies   Patient has no known allergies.   Review of Systems Review of Systems  Constitutional:       See HPI     Physical Exam Triage Vital Signs ED Triage Vitals [10/03/17 1300]  Enc Vitals Group     BP 118/74     Pulse Rate 79     Resp 18     Temp 98.1 F (36.7 C)     Temp src      SpO2 100 %     Weight      Height      Head Circumference      Peak Flow      Pain Score    Pain Loc      Pain Edu?      Excl. in GC?    No data found.  Updated Vital Signs BP 118/74   Pulse 79   Temp 98.1 F (36.7 C)   Resp 18   SpO2 100%   Visual Acuity Right Eye Distance:   Left Eye Distance:   Bilateral Distance:    Right Eye Near:   Left Eye Near:    Bilateral Near:     Physical Exam  Constitutional: She is oriented to person, place, and time. She appears well-developed and well-nourished.  Cardiovascular: Normal rate.  Pulmonary/Chest: Effort normal. She has no wheezes.  Neurological: She is alert and oriented to person, place, and time.  Skin:  Has an 5 cm erythematous circular flat rash on left thigh with a dark red center, no streaking, not warm but is sore to palpate.   Also has a 1cm similar rash on right waist area.   Nursing note and vitals reviewed.    UC Treatments / Results  Labs (all labs ordered  are listed, but only abnormal results are displayed) Labs Reviewed - No data to display  EKG  EKG Interpretation None       Radiology No results found.  Procedures Procedures (including critical care time)  Medications Ordered in UC Medications - No data to display   Initial Impression / Assessment and Plan / UC Course  I have reviewed the triage vital signs and the nursing notes.  Pertinent labs & imaging results that were available during my care of the patient were reviewed by me and considered in my medical decision making (see chart for details).   Final Clinical Impressions(s) / UC Diagnoses   Final diagnoses:  Insect bite, initial encounter  Cellulitis of lower extremity, unspecified laterality   Rash consistent with insect bite that may appear to be slightly infected. She doesn't want to take oral antibiotic, will prescribe Bactroban, which I believe would be sufficient for her. However did informed patient that if her rash doesn't improve while on topical treatment, then she would need to go back on oral doxycycline.     ED Discharge Orders        Ordered    mupirocin cream (BACTROBAN) 2 %  2 times daily     10/03/17 1325       Controlled Substance Prescriptions [place] Controlled Substance Registry consulted? Not Applicable   Lucia Estelle, NP 10/03/17 1333

## 2017-10-03 NOTE — ED Triage Notes (Signed)
Pt here for multiple abscesses to body. sts some drainage.

## 2017-12-30 ENCOUNTER — Ambulatory Visit (HOSPITAL_COMMUNITY)
Admission: EM | Admit: 2017-12-30 | Discharge: 2017-12-30 | Disposition: A | Discharge status: Home / Self Care | Payer: Medicaid Other | Attending: Family Medicine | Admitting: Family Medicine

## 2017-12-30 ENCOUNTER — Other Ambulatory Visit: Payer: Self-pay

## 2017-12-30 ENCOUNTER — Encounter (HOSPITAL_COMMUNITY): Payer: Self-pay

## 2017-12-30 DIAGNOSIS — M25512 Pain in left shoulder: Secondary | ICD-10-CM

## 2017-12-30 DIAGNOSIS — M791 Myalgia, unspecified site: Secondary | ICD-10-CM | POA: Diagnosis not present

## 2017-12-30 DIAGNOSIS — R0789 Other chest pain: Secondary | ICD-10-CM
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
## 2018-03-30 ENCOUNTER — Encounter (HOSPITAL_COMMUNITY): Payer: Self-pay | Admitting: Emergency Medicine

## 2018-03-30 ENCOUNTER — Ambulatory Visit (HOSPITAL_COMMUNITY)
Admission: EM | Admit: 2018-03-30 | Discharge: 2018-03-30 | Disposition: A | Discharge status: Home / Self Care | Payer: Medicaid Other | Attending: Family Medicine | Admitting: Family Medicine

## 2018-03-30 DIAGNOSIS — R079 Chest pain, unspecified: Secondary | ICD-10-CM

## 2018-03-30 DIAGNOSIS — R1010 Upper abdominal pain, unspecified: Secondary | ICD-10-CM

## 2018-03-30 DIAGNOSIS — R42 Dizziness and giddiness: Secondary | ICD-10-CM

## 2018-03-30 DIAGNOSIS — K219 Gastro-esophageal reflux disease without esophagitis: Secondary | ICD-10-CM

## 2018-03-30 MED ORDER — OMEPRAZOLE 20 MG PO CPDR: 20.0000 mg | DELAYED_RELEASE_CAPSULE | Freq: Every day | ORAL | 0 refills | Status: DC

## 2018-03-30 NOTE — Discharge Instructions (Signed)
Take the omeprazole once a day Try to take care of yourself and get enough sleep Return as needed

## 2018-03-30 NOTE — ED Triage Notes (Signed)
Pt states since Saturday shes been feeling "Jittery". States every time she drinks she feels a sharp pain. No pain with eating. Pt also c/o heartburn. Hx of anxiety.

## 2018-03-30 NOTE — ED Provider Notes (Signed)
[facility]    CSN: [id number] Arrival date & time: 03/30/18  1634     History   Chief Complaint No chief complaint on file.   HPI Joyce Hernandez is a 22 y.o. female.   HPI  Patient states she has a history of acid reflux.  She is had an EGD before.  She used to be on medication.  She no longer takes this medication.  She is here for pain in her upper abdomen with drinking fluids.  She states this radiates to her right shoulder blade.  She gets pain immediately with certain foods and fluids.  She is not on any medicine.  She denies stress.  She denies anxiety.  She has not had any weight loss.  No nausea or vomiting.  Her appetite is somewhat decreased.  She states been worse last couple weeks.  She states that today while she was shopping she felt "jittery" in her hands went numb.  This made her very upset.  She is here for evaluation.  Occasionally she gets "twinges" of pain in her chest.  Not exertional.  No radiation.  No diaphoresis.  No history of heart disease.  No cardiac risk factors such as cigarette smoking hypertension, diabetes family history, obesity or sedentary lifestyle.  He does not drink alcohol.  She works part-time and is in college getting a Musician.  History reviewed. No pertinent past medical history.  There are no active problems to display for this patient.   Past Surgical History:  Procedure Laterality Date  . BREAST SURGERY     lymph node removal    OB History   None      Home Medications    Prior to Admission medications   Medication Sig Start Date End Date Taking? Authorizing Provider  acetaminophen (TYLENOL) 500 MG tablet Take 500 mg by mouth 2 (two) times daily as needed for moderate pain or headache.    [provider]  omeprazole (PRILOSEC) 20 MG capsule Take 1 capsule (20 mg total) by mouth daily. 03/30/18   Eustace Moore, MD    Family History Family History  Problem Relation Age of Onset  . Healthy  Mother   . Healthy Father   Denies history of heart disease  Social History Social History   Tobacco Use  . Smoking status: Never Smoker  . Smokeless tobacco: Never Used  Substance Use Topics  . Alcohol use: No  . Drug use: No     Allergies   Patient has no known allergies.   Review of Systems Review of Systems  Constitutional: Positive for appetite change. Negative for chills and fever.  HENT: Negative for ear pain and sore throat.   Eyes: Negative for pain and visual disturbance.  Respiratory: Negative for cough and shortness of breath.   Cardiovascular: Positive for chest pain. Negative for palpitations.  Gastrointestinal: Positive for abdominal pain. Negative for vomiting.  Genitourinary: Negative for dysuria and hematuria.  Musculoskeletal: Negative for arthralgias and back pain.  Skin: Negative for color change and rash.  Neurological: Positive for light-headedness and numbness. Negative for seizures and syncope.       Spell in store with lightheadedness, numbness, visual change, "jittery".  All other systems reviewed and are negative.    Physical Exam Triage Vital Signs ED Triage Vitals [03/30/18 1705]  Enc Vitals Group     BP 139/86     Pulse Rate 95     Resp 18     Temp  98.6 F (37 C)     Temp src      SpO2 100 %     Weight      Height      Head Circumference      Peak Flow      Pain Score 0     Pain Loc      Pain Edu?      Excl. in GC?    No data found.  Updated Vital Signs BP 139/86   Pulse 95   Temp 98.6 F (37 C)   Resp 18   LMP 03/29/2018   SpO2 100%   Visual Acuity Right Eye Distance:   Left Eye Distance:   Bilateral Distance:    Right Eye Near:   Left Eye Near:    Bilateral Near:     Physical Exam  Constitutional: She appears well-developed and well-nourished. No distress.  HENT:  Head: Normocephalic and atraumatic.  Right Ear: External ear normal.  Left Ear: External ear normal.  Mouth/Throat: Oropharynx is clear and  moist.  Eyes: Pupils are equal, round, and reactive to light. Conjunctivae are normal.  Neck: Normal range of motion. No thyromegaly present.  Cardiovascular: Normal rate, regular rhythm and normal heart sounds.  EKG normal  Pulmonary/Chest: Effort normal and breath sounds normal. No respiratory distress.  Abdominal: Soft. Bowel sounds are normal. She exhibits no distension. There is tenderness.  Mild upper epigastric tenderness  Musculoskeletal: Normal range of motion. She exhibits no edema.  Neurological: She is alert.  Skin: Skin is warm and dry.  Psychiatric: She has a normal mood and affect. Her behavior is normal.     UC Treatments / Results  Labs (all labs ordered are listed, but only abnormal results are displayed) Labs Reviewed - No data to display  EKG None  Radiology No results found.  Procedures Procedures (including critical care time)  Medications Ordered in UC Medications - No data to display  Initial Impression / Assessment and Plan / UC Course  I have reviewed the triage vital signs and the nursing notes.  Pertinent labs & imaging results that were available during my care of the patient were reviewed by me and considered in my medical decision making (see chart for details).     *Discussed with patient that I think her discomfort is largely gastrointestinal.  I am putting her back on omeprazole.  She should watch her diet. I discussed with patient that I believe this reaction in the store was an anxiety reaction.  She states she is not stressed, but with a job and college, her stress is likely more than she recognizes.   Final Clinical Impressions(s) / UC Diagnoses   Final diagnoses:  Gastroesophageal reflux disease, esophagitis presence not specified     Discharge Instructions     Take the omeprazole once a day Try to take care of yourself and get enough sleep Return as needed   ED Prescriptions    Medication Sig Dispense Auth. Provider    omeprazole (PRILOSEC) 20 MG capsule Take 1 capsule (20 mg total) by mouth daily. 30 capsule Eustace MooreNelson, Bunyan Brier Sue, MD     Controlled Substance Prescriptions [place] Controlled Substance Registry consulted? Not Applicable   Eustace MooreNelson, Jevonte Clanton Sue, MD 03/30/18 1950

## 2018-12-17 ENCOUNTER — Other Ambulatory Visit: Payer: Self-pay

## 2018-12-17 ENCOUNTER — Ambulatory Visit (HOSPITAL_COMMUNITY)
Admission: EM | Admit: 2018-12-17 | Discharge: 2018-12-17 | Disposition: A | Discharge status: Home / Self Care | Payer: BLUE CROSS/BLUE SHIELD | Attending: Family Medicine | Admitting: Family Medicine

## 2018-12-17 ENCOUNTER — Encounter (HOSPITAL_COMMUNITY): Payer: Self-pay | Admitting: Emergency Medicine

## 2018-12-17 DIAGNOSIS — Z3202 Encounter for pregnancy test, result negative: Secondary | ICD-10-CM

## 2018-12-17 DIAGNOSIS — N760 Acute vaginitis: Secondary | ICD-10-CM | POA: Insufficient documentation

## 2018-12-17 LAB — POCT URINALYSIS DIP (DEVICE)
Bilirubin Urine: NEGATIVE
GLUCOSE, UA: NEGATIVE mg/dL
Hgb urine dipstick: NEGATIVE
Ketones, ur: NEGATIVE mg/dL
Nitrite: NEGATIVE
Protein, ur: NEGATIVE mg/dL
UROBILINOGEN UA: 0.2 mg/dL (ref 0.0–1.0)
pH: 5.5 (ref 5.0–8.0)

## 2018-12-17 LAB — POCT PREGNANCY, URINE: PREG TEST UR: NEGATIVE

## 2018-12-17 MED ORDER — METRONIDAZOLE 500 MG PO TABS: 500.0000 mg | ORAL_TABLET | Freq: Two times a day (BID) | ORAL | 0 refills | Status: AC

## 2018-12-17 NOTE — ED Triage Notes (Signed)
Pt here for vaginal odor

## 2018-12-17 NOTE — Discharge Instructions (Signed)
Begin taking metronidazole twice daily for the next week to treat for bacterial vaginosis.  Please do not drink alcohol until 24 hours after taking last tablet.  We are testing you for Gonorrhea, Chlamydia, Trichomonas, Yeast and Bacterial Vaginosis. We will call you if anything is positive and let you know if you require any further treatment. Please inform partners of any positive results.   Please return if symptoms not improving with treatment, development of fever, nausea, vomiting, abdominal pain.

## 2018-12-17 NOTE — ED Provider Notes (Signed)
[facility]    CSN: [id number] Arrival date & time: 12/17/18  1003     History   Chief Complaint Chief Complaint  Patient presents with  . Vaginal Discharge    HPI Joyce Hernandez is a 23 y.o. female no significant past medical history presenting today for evaluation of vaginal odor.  Patient states that for the past couple weeks she has had odor which is progressively worsened.  Describes odor as "dead organism".  She notes her symptoms started after she went white water rafting.  She initially had a cloudy white discharge, but this has resolved.  Denies itching or irritation.  Denies urinary symptoms of dysuria or increased frequency.  Last menstrual period was around 3/7.  Denies specific concerns for STDs.  Denies fevers, nausea, vomiting, abdominal pain or pelvic pain.  Denies rashes or lesions.  Does states she has a history of BV.  HPI  History reviewed. No pertinent past medical history.  There are no active problems to display for this patient.   Past Surgical History:  Procedure Laterality Date  . BREAST SURGERY     lymph node removal    OB History   No obstetric history on file.      Home Medications    Prior to Admission medications   Medication Sig Start Date End Date Taking? Authorizing Provider  acetaminophen (TYLENOL) 500 MG tablet Take 500 mg by mouth 2 (two) times daily as needed for moderate pain or headache.    [provider]  metroNIDAZOLE (FLAGYL) 500 MG tablet Take 1 tablet (500 mg total) by mouth 2 (two) times daily for 7 days. 12/17/18 12/24/18  Wieters, Hallie C, PA-C  omeprazole (PRILOSEC) 20 MG capsule Take 1 capsule (20 mg total) by mouth daily. 03/30/18   Eustace Moore, MD    Family History Family History  Problem Relation Age of Onset  . Healthy Mother   . Healthy Father     Social History Social History   Tobacco Use  . Smoking status: Never Smoker  . Smokeless tobacco: Never Used  Substance Use Topics   . Alcohol use: No  . Drug use: No     Allergies   Patient has no known allergies.   Review of Systems Review of Systems  Constitutional: Negative for fever.  Respiratory: Negative for shortness of breath.   Cardiovascular: Negative for chest pain.  Gastrointestinal: Negative for abdominal pain, diarrhea, nausea and vomiting.  Genitourinary: Negative for dysuria, flank pain, genital sores, hematuria, menstrual problem, vaginal bleeding, vaginal discharge and vaginal pain.  Musculoskeletal: Negative for back pain.  Skin: Negative for rash.  Neurological: Negative for dizziness, light-headedness and headaches.     Physical Exam Triage Vital Signs ED Triage Vitals  Enc Vitals Group     BP 12/17/18 1042 115/82     Pulse Rate 12/17/18 1042 67     Resp 12/17/18 1042 18     Temp 12/17/18 1042 98.3 F (36.8 C)     Temp Source 12/17/18 1042 Oral     SpO2 12/17/18 1042 98 %     Weight --      Height --      Head Circumference --      Peak Flow --      Pain Score 12/17/18 1044 0     Pain Loc --      Pain Edu? --      Excl. in GC? --    No data found.  Updated  Vital Signs BP 115/82 (BP Location: Right Arm)   Pulse 67   Temp 98.3 F (36.8 C) (Oral)   Resp 18   SpO2 98%   Visual Acuity Right Eye Distance:   Left Eye Distance:   Bilateral Distance:    Right Eye Near:   Left Eye Near:    Bilateral Near:     Physical Exam Vitals signs and nursing note reviewed.  Constitutional:      Appearance: She is well-developed.     Comments: No acute distress  HENT:     Head: Normocephalic and atraumatic.     Nose: Nose normal.  Eyes:     Conjunctiva/sclera: Conjunctivae normal.  Neck:     Musculoskeletal: Neck supple.  Cardiovascular:     Rate and Rhythm: Normal rate.  Pulmonary:     Effort: Pulmonary effort is normal. No respiratory distress.  Abdominal:     General: There is no distension.     Comments: Nontender to light and deep palpation throughout all 4  quadrants and epigastrium and suprapubic area of abdomen  Musculoskeletal: Normal range of motion.  Skin:    General: Skin is warm and dry.  Neurological:     Mental Status: She is alert and oriented to person, place, and time.      UC Treatments / Results  Labs (all labs ordered are listed, but only abnormal results are displayed) Labs Reviewed  POCT URINALYSIS DIP (DEVICE) - Abnormal; Notable for the following components:      Result Value   Leukocytes,Ua TRACE (*)    All other components within normal limits  POC URINE PREG, ED  CERVICOVAGINAL ANCILLARY ONLY    EKG None  Radiology No results found.  Procedures Procedures (including critical care time)  Medications Ordered in UC Medications - No data to display  Initial Impression / Assessment and Plan / UC Course  I have reviewed the triage vital signs and the nursing notes.  Pertinent labs & imaging results that were available during my care of the patient were reviewed by me and considered in my medical decision making (see chart for details).     Patient with vaginal odor, will empirically treat for BV with metronidazole twice daily x7 days.  Vaginal swab obtained, will send off to confirm as well as check for other causes.  Will call patient with results and alter treatment as needed.Discussed strict return precautions. Patient verbalized understanding and is agreeable with plan.  Final Clinical Impressions(s) / UC Diagnoses   Final diagnoses:  Acute vaginitis     Discharge Instructions     Begin taking metronidazole twice daily for the next week to treat for bacterial vaginosis.  Please do not drink alcohol until 24 hours after taking last tablet.  We are testing you for Gonorrhea, Chlamydia, Trichomonas, Yeast and Bacterial Vaginosis. We will call you if anything is positive and let you know if you require any further treatment. Please inform partners of any positive results.   Please return if  symptoms not improving with treatment, development of fever, nausea, vomiting, abdominal pain.     ED Prescriptions    Medication Sig Dispense Auth. Provider   metroNIDAZOLE (FLAGYL) 500 MG tablet Take 1 tablet (500 mg total) by mouth 2 (two) times daily for 7 days. 14 tablet Wieters, [place] C, PA-C     Controlled Substance Prescriptions [place] Controlled Substance Registry consulted? Not Applicable   Lew Dawes, [place] 12/17/18 1130

## 2018-12-17 NOTE — ED Notes (Signed)
Patient verbalizes understanding of discharge instructions. Opportunity for questioning and answers were provided. Patient discharged from [facility] by RN.  

## 2018-12-18 LAB — CERVICOVAGINAL ANCILLARY ONLY
Bacterial vaginitis: POSITIVE — AB
CHLAMYDIA, DNA PROBE: NEGATIVE
Candida vaginitis: NEGATIVE
Neisseria Gonorrhea: NEGATIVE
TRICH (WINDOWPATH): NEGATIVE

## 2019-01-07 ENCOUNTER — Other Ambulatory Visit: Payer: Self-pay

## 2019-01-07 ENCOUNTER — Ambulatory Visit (HOSPITAL_COMMUNITY)
Admission: EM | Admit: 2019-01-07 | Discharge: 2019-01-07 | Disposition: A | Discharge status: Home / Self Care | Payer: BLUE CROSS/BLUE SHIELD | Attending: Family Medicine | Admitting: Family Medicine

## 2019-01-07 ENCOUNTER — Encounter (HOSPITAL_COMMUNITY): Payer: Self-pay

## 2019-01-07 DIAGNOSIS — N9089 Other specified noninflammatory disorders of vulva and perineum: Secondary | ICD-10-CM | POA: Diagnosis not present

## 2019-01-07 MED ORDER — FLUCONAZOLE 150 MG PO TABS: 150.0000 mg | ORAL_TABLET | Freq: Every day | ORAL | 0 refills | Status: DC

## 2019-01-07 NOTE — Discharge Instructions (Signed)
Start diflucan for possible yeast infection. As discussed, avoid soap a this time. Warm/cool compresses can help with symptoms. Avoid using chemical hair removal for now. Monitor for spreading redness, warmth, fever, follow up for reevaluation needed.

## 2019-01-07 NOTE — ED Provider Notes (Signed)
[facility]    CSN: [id number] Arrival date & time: 01/07/19  1811     History   Chief Complaint Chief Complaint  Patient presents with  . Vaginal Pain    HPI Joyce Hernandez is a 23 y.o. female.   23 year old female comes in for 2 day history of itching and painful labia.  States also feels slightly swollen.  She noticed the discharge status cottage cheeselike.  Denies odor.  Denies abdominal pain, nausea, vomiting.  Denies fever, chills, night sweats.  Denies urinary symptoms such as frequency, dysuria, hematuria.  LMP 01/07/2019.  Sexually active with one female partner, occasional condom use.  She recently started a new body wash.  Also used chemical hair removal prior to current symptom onset. No worries for STDs.      History reviewed. No pertinent past medical history.  There are no active problems to display for this patient.   Past Surgical History:  Procedure Laterality Date  . BREAST SURGERY     lymph node removal    OB History   No obstetric history on file.      Home Medications    Prior to Admission medications   Medication Sig Start Date End Date Taking? Authorizing Provider  acetaminophen (TYLENOL) 500 MG tablet Take 500 mg by mouth 2 (two) times daily as needed for moderate pain or headache.    [provider]  fluconazole (DIFLUCAN) 150 MG tablet Take 1 tablet (150 mg total) by mouth daily. Take second dose 72 hours later if symptoms still persists. 01/07/19   Cathie HoopsYu, Amy V, PA-C  omeprazole (PRILOSEC) 20 MG capsule Take 1 capsule (20 mg total) by mouth daily. 03/30/18   Eustace MooreNelson, Yvonne Sue, MD    Family History Family History  Problem Relation Age of Onset  . Healthy Mother   . Healthy Father     Social History Social History   Tobacco Use  . Smoking status: Never Smoker  . Smokeless tobacco: Never Used  Substance Use Topics  . Alcohol use: No  . Drug use: No     Allergies   Patient has no known allergies.   Review  of Systems Review of Systems  Reason unable to perform ROS: See HPI as above.     Physical Exam Triage Vital Signs ED Triage Vitals  Enc Vitals Group     BP 01/07/19 1823 116/73     Pulse Rate 01/07/19 1823 72     Resp 01/07/19 1823 18     Temp 01/07/19 1823 98.3 F (36.8 C)     Temp src --      SpO2 01/07/19 1823 100 %     Weight --      Height --      Head Circumference --      Peak Flow --      Pain Score 01/07/19 1827 0     Pain Loc --      Pain Edu? --      Excl. in GC? --    No data found.  Updated Vital Signs BP 116/73   Pulse 72   Temp 98.3 F (36.8 C)   Resp 18   LMP 01/07/2019   SpO2 100%   Physical Exam Exam conducted with a chaperone present.  Constitutional:      General: She is not in acute distress.    Appearance: She is well-developed. She is not ill-appearing, toxic-appearing or diaphoretic.  HENT:  Head: Normocephalic and atraumatic.  Eyes:     Conjunctiva/sclera: Conjunctivae normal.     Pupils: Pupils are equal, round, and reactive to light.  Cardiovascular:     Rate and Rhythm: Normal rate and regular rhythm.     Heart sounds: Normal heart sounds. No murmur. No friction rub. No gallop.   Pulmonary:     Effort: Pulmonary effort is normal.     Breath sounds: Normal breath sounds. No wheezing or rales.  Abdominal:     General: Bowel sounds are normal.     Palpations: Abdomen is soft.     Tenderness: There is no abdominal tenderness. There is no right CVA tenderness, left CVA tenderness, guarding or rebound.  Genitourinary:    Comments: Mild swelling to the labia minora.  No swelling to the labia majora.  No erythema, warmth.  No sores/ulcers seen.  No tenderness to palpation of the labia. Patient with tampon in vaginal canal, no speculum exam preformed.  Skin:    General: Skin is warm and dry.  Neurological:     Mental Status: She is alert and oriented to person, place, and time.  Psychiatric:        Behavior: Behavior normal.         Judgment: Judgment normal.      UC Treatments / Results  Labs (all labs ordered are listed, but only abnormal results are displayed) Labs Reviewed - No data to display  EKG None  Radiology No results found.  Procedures Procedures (including critical care time)  Medications Ordered in UC Medications - No data to display  Initial Impression / Assessment and Plan / UC Course  I have reviewed the triage vital signs and the nursing notes.  Pertinent labs & imaging results that were available during my care of the patient were reviewed by me and considered in my medical decision making (see chart for details).    Will cover for yeast with Diflucan.  Discussed possible irritation from chemical hair removal, new hygiene product.  Will have patient refrain from soap and chemical hair removal at this time.  No signs of cellulitis on exam.  Continue to monitor.  Return precautions given.  Patient expresses understanding and agrees to plan.  Final Clinical Impressions(s) / UC Diagnoses   Final diagnoses:  Labial irritation   ED Prescriptions    Medication Sig Dispense Auth. Provider   fluconazole (DIFLUCAN) 150 MG tablet  (Status: Discontinued) Take 1 tablet (150 mg total) by mouth daily. Take second dose 72 hours later if symptoms still persists. 2 tablet Yu, Amy V, PA-C   fluconazole (DIFLUCAN) 150 MG tablet Take 1 tablet (150 mg total) by mouth daily. Take second dose 72 hours later if symptoms still persists. 2 tablet Threasa Alpha, [place] 01/07/19 1909

## 2019-01-07 NOTE — ED Triage Notes (Signed)
Painful, itching and swollen labia began yesterday

## 2019-02-07 ENCOUNTER — Other Ambulatory Visit: Payer: Self-pay

## 2019-02-07 ENCOUNTER — Encounter (HOSPITAL_COMMUNITY): Payer: Self-pay

## 2019-02-07 ENCOUNTER — Ambulatory Visit (HOSPITAL_COMMUNITY)
Admission: EM | Admit: 2019-02-07 | Discharge: 2019-02-07 | Disposition: A | Discharge status: Home / Self Care | Payer: BLUE CROSS/BLUE SHIELD | Attending: Family Medicine | Admitting: Family Medicine

## 2019-02-07 DIAGNOSIS — Z113 Encounter for screening for infections with a predominantly sexual mode of transmission: Secondary | ICD-10-CM

## 2019-02-07 DIAGNOSIS — Z3202 Encounter for pregnancy test, result negative: Secondary | ICD-10-CM

## 2019-02-07 DIAGNOSIS — N760 Acute vaginitis: Secondary | ICD-10-CM | POA: Insufficient documentation

## 2019-02-07 DIAGNOSIS — B9689 Other specified bacterial agents as the cause of diseases classified elsewhere: Secondary | ICD-10-CM | POA: Diagnosis not present

## 2019-02-07 LAB — POCT URINALYSIS DIP (DEVICE)
Bilirubin Urine: NEGATIVE
Glucose, UA: NEGATIVE mg/dL
Hgb urine dipstick: NEGATIVE
Ketones, ur: NEGATIVE mg/dL
Nitrite: NEGATIVE
Protein, ur: NEGATIVE mg/dL
Specific Gravity, Urine: >=1.03 (ref 1.005–1.030)
Urobilinogen, UA: 0.2 mg/dL (ref 0.0–1.0)
pH: 6 (ref 5.0–8.0)

## 2019-02-07 LAB — POCT PREGNANCY, URINE: Preg Test, Ur: NEGATIVE

## 2019-02-07 MED ORDER — FLUCONAZOLE 150 MG PO TABS: ORAL_TABLET | ORAL | 0 refills | Status: DC

## 2019-02-07 MED ORDER — METRONIDAZOLE 500 MG PO TABS: 500.0000 mg | ORAL_TABLET | Freq: Two times a day (BID) | ORAL | 0 refills | Status: DC

## 2019-02-07 NOTE — ED Triage Notes (Signed)
Patient presents to Urgent Care with complaints of vaginal discharge, recurrent for approx one month. Patient reports the discharge has an odor and she thinks it is BV again.

## 2019-02-07 NOTE — ED Provider Notes (Signed)
[facility]    CSN: [id number] Arrival date & time: 02/07/19  0847     History   Chief Complaint Chief Complaint  Patient presents with  . vaginal discharge    HPI Joyce Hernandez is a 23 y.o. female.   HPI  Patient has had recurring bacterial vaginosis.  She states that last month she has had some vaginal discharge, irritation, and odor off and on.  He feels like it is getting worse over time. She has had unprotected sexual relations.  She like to be tested for STDs.  Declines blood work for RPR and HIV against my medical advice She also requests pregnancy testing.  She has not taking any action to prevent pregnancy.  We discussed the importance of actively preventing pregnancy if this is not a good time for her to have a baby.  Recommend follow-up with GYN.  Recommend condoms/safe sex  History reviewed. No pertinent past medical history.  There are no active problems to display for this patient.   Past Surgical History:  Procedure Laterality Date  . BREAST SURGERY     lymph node removal    OB History   No obstetric history on file.      Home Medications    Prior to Admission medications   Medication Sig Start Date End Date Taking? Authorizing Provider  acetaminophen (TYLENOL) 500 MG tablet Take 500 mg by mouth 2 (two) times daily as needed for moderate pain or headache.    [provider]  fluconazole (DIFLUCAN) 150 MG tablet Take one today and one at the end of the antibiotics 02/07/19   Eustace MooreNelson, Ervie Mccard Sue, MD  metroNIDAZOLE (FLAGYL) 500 MG tablet Take 1 tablet (500 mg total) by mouth 2 (two) times daily. 02/07/19   Eustace MooreNelson, Candra Wegner Sue, MD  omeprazole (PRILOSEC) 20 MG capsule Take 1 capsule (20 mg total) by mouth daily. 03/30/18   Eustace MooreNelson, Anapaula Severt Sue, MD    Family History Family History  Problem Relation Age of Onset  . Healthy Mother   . Healthy Father     Social History Social History   Tobacco Use  . Smoking status: Never Smoker  .  Smokeless tobacco: Never Used  Substance Use Topics  . Alcohol use: Yes    Comment: socially   . Drug use: No     Allergies   Patient has no known allergies.   Review of Systems Review of Systems  Constitutional: Negative for chills and fever.  HENT: Negative for ear pain and sore throat.   Eyes: Negative for pain and visual disturbance.  Respiratory: Negative for cough and shortness of breath.   Cardiovascular: Negative for chest pain and palpitations.  Gastrointestinal: Negative for abdominal pain and vomiting.  Genitourinary: Positive for vaginal discharge. Negative for dysuria and hematuria.  Musculoskeletal: Negative for arthralgias and back pain.  Skin: Negative for color change and rash.  Neurological: Negative for seizures and syncope.  All other systems reviewed and are negative.    Physical Exam Triage Vital Signs ED Triage Vitals  Enc Vitals Group     BP 02/07/19 0914 111/80     Pulse Rate 02/07/19 0914 72     Resp 02/07/19 0914 18     Temp 02/07/19 0914 98.5 F (36.9 C)     Temp Source 02/07/19 0914 Oral     SpO2 02/07/19 0914 100 %     Weight --      Height --      Head Circumference --  Peak Flow --      Pain Score 02/07/19 0913 0     Pain Loc --      Pain Edu? --      Excl. in GC? --    No data found.  Updated Vital Signs BP 111/80 (BP Location: Left Arm)   Pulse 72   Temp 98.5 F (36.9 C) (Oral)   Resp 18   SpO2 100%   Visual Acuity Right Eye Distance:   Left Eye Distance:   Bilateral Distance:    Right Eye Near:   Left Eye Near:    Bilateral Near:     Physical Exam Constitutional:      General: She is not in acute distress.    Appearance: She is well-developed.  HENT:     Head: Normocephalic and atraumatic.  Eyes:     Conjunctiva/sclera: Conjunctivae normal.     Pupils: Pupils are equal, round, and reactive to light.  Neck:     Musculoskeletal: Normal range of motion.  Cardiovascular:     Rate and Rhythm: Normal rate.   Pulmonary:     Effort: Pulmonary effort is normal. No respiratory distress.  Abdominal:     General: There is no distension.     Palpations: Abdomen is soft.  Genitourinary:    Comments: Exam deferred Musculoskeletal: Normal range of motion.  Skin:    General: Skin is warm and dry.  Neurological:     Mental Status: She is alert.      UC Treatments / Results  Labs (all labs ordered are listed, but only abnormal results are displayed) Labs Reviewed  POCT URINALYSIS DIP (DEVICE) - Abnormal; Notable for the following components:      Result Value   Leukocytes,Ua SMALL (*)    All other components within normal limits  POC URINE PREG, ED  POCT PREGNANCY, URINE  CERVICOVAGINAL ANCILLARY ONLY    EKG None  Radiology No results found.  Procedures Procedures (including critical care time)  Medications Ordered in UC Medications - No data to display  Initial Impression / Assessment and Plan / UC Course  I have reviewed the triage vital signs and the nursing notes.  Pertinent labs & imaging results that were available during my care of the patient were reviewed by me and considered in my medical decision making (see chart for details).      Final Clinical Impressions(s) / UC Diagnoses   Final diagnoses:  BV (bacterial vaginosis)     Discharge Instructions     Take the metronidazole as directed for 7 days Take the Diflucan now and again in 7 days This will treat both BV and yeast infections Consider taking a probiotic ointment daily.  This may help prevent recurrence Follow-up with women's health for birth control options    ED Prescriptions    Medication Sig Dispense Auth. Provider   fluconazole (DIFLUCAN) 150 MG tablet Take one today and one at the end of the antibiotics 2 tablet Eustace Moore, MD   metroNIDAZOLE (FLAGYL) 500 MG tablet Take 1 tablet (500 mg total) by mouth 2 (two) times daily. 14 tablet Eustace Moore, MD     Controlled Substance  Prescriptions [place] Controlled Substance Registry consulted? Not Applicable   Eustace Moore, MD 02/07/19 1010

## 2019-02-07 NOTE — Discharge Instructions (Signed)
Take the metronidazole as directed for 7 days Take the Diflucan now and again in 7 days This will treat both BV and yeast infections Consider taking a probiotic ointment daily.  This may help prevent recurrence Follow-up with women's health for birth control options

## 2019-02-08 LAB — CERVICOVAGINAL ANCILLARY ONLY
Chlamydia: POSITIVE — AB
Neisseria Gonorrhea: NEGATIVE
Trichomonas: NEGATIVE

## 2019-02-09 ENCOUNTER — Ambulatory Visit (HOSPITAL_COMMUNITY)
Admission: EM | Admit: 2019-02-09 | Discharge: 2019-02-09 | Discharge status: Absent without leave | Payer: BLUE CROSS/BLUE SHIELD

## 2019-02-09 ENCOUNTER — Telehealth (HOSPITAL_COMMUNITY): Payer: Self-pay | Admitting: Emergency Medicine

## 2019-02-09 MED ORDER — AZITHROMYCIN 250 MG PO TABS: 1000.0000 mg | ORAL_TABLET | Freq: Once | ORAL | 0 refills | Status: AC

## 2019-02-09 NOTE — Telephone Encounter (Signed)
Pt came into the office today for treatment, does not require another visit, will send medicine to perferred pharmacy.  Chlamydia is positive.  Rx po zithromax 1g #1 dose no refills was sent to the pharmacy of record.  Pt needs education to please refrain from sexual intercourse for 7 days to give the medicine time to work, sexual partners need to be notified and tested/treated.  Condoms may reduce risk of reinfection.  Recheck or followup with PCP for further evaluation if symptoms are not improving.   [facility] notified.

## 2019-03-06 ENCOUNTER — Ambulatory Visit (HOSPITAL_COMMUNITY)
Admission: EM | Admit: 2019-03-06 | Discharge: 2019-03-06 | Disposition: A | Discharge status: Home / Self Care | Payer: BLUE CROSS/BLUE SHIELD | Attending: Internal Medicine | Admitting: Internal Medicine

## 2019-03-06 ENCOUNTER — Encounter (HOSPITAL_COMMUNITY): Payer: Self-pay | Admitting: Emergency Medicine

## 2019-03-06 ENCOUNTER — Other Ambulatory Visit: Payer: Self-pay

## 2019-03-06 DIAGNOSIS — Z113 Encounter for screening for infections with a predominantly sexual mode of transmission: Secondary | ICD-10-CM | POA: Diagnosis not present

## 2019-03-06 DIAGNOSIS — B3731 Acute candidiasis of vulva and vagina: Secondary | ICD-10-CM

## 2019-03-06 DIAGNOSIS — Z202 Contact with and (suspected) exposure to infections with a predominantly sexual mode of transmission: Secondary | ICD-10-CM | POA: Diagnosis not present

## 2019-03-06 DIAGNOSIS — B373 Candidiasis of vulva and vagina: Secondary | ICD-10-CM | POA: Diagnosis not present

## 2019-03-06 LAB — POCT URINALYSIS DIP (DEVICE)
Bilirubin Urine: NEGATIVE
Glucose, UA: NEGATIVE mg/dL
Hgb urine dipstick: NEGATIVE
Ketones, ur: NEGATIVE mg/dL
Nitrite: NEGATIVE
Protein, ur: NEGATIVE mg/dL
Specific Gravity, Urine: >=1.03 (ref 1.005–1.030)
Urobilinogen, UA: 1 mg/dL (ref 0.0–1.0)
pH: 6.5 (ref 5.0–8.0)

## 2019-03-06 MED ORDER — FLUCONAZOLE 150 MG PO TABS: ORAL_TABLET | ORAL | 0 refills | Status: DC

## 2019-03-06 NOTE — ED Provider Notes (Signed)
[place]    CSN: [id number] Arrival date & time: 03/06/19  1502      History   Chief Complaint Chief Complaint  Patient presents with  . Dysuria  . Exposure to STD    HPI Joyce Hernandez is a 23 y.o. female with no past medical history comes to urgent care with complaints of dysuria,pruritic vaginal discharge.  Vaginal discharge is white, thick and curdy.  Is associated with pruritus.  Patient denies any fever or chills.  She has not tried any over-the-counter medication.  She denies any urgency, flank pain nausea or vomiting.  Patient is sexually active.  She has sexual partner who may be sexually active with either women.  She would like to be screened for STD.  Patient was recently screened for STD.Marland Kitchen   HPI  History reviewed. No pertinent past medical history.  There are no active problems to display for this patient.   Past Surgical History:  Procedure Laterality Date  . BREAST SURGERY     lymph node removal    OB History   No obstetric history on file.      Home Medications    Prior to Admission medications   Medication Sig Start Date End Date Taking? Authorizing Provider  acetaminophen (TYLENOL) 500 MG tablet Take 500 mg by mouth 2 (two) times daily as needed for moderate pain or headache.    [provider]  fluconazole (DIFLUCAN) 150 MG tablet Take one today and take another tablet in 3 days if no improvement. 03/06/19   Chase Picket, MD  omeprazole (PRILOSEC) 20 MG capsule Take 1 capsule (20 mg total) by mouth daily. 03/30/18   Raylene Everts, MD    Family History Family History  Problem Relation Age of Onset  . Healthy Mother   . Healthy Father     Social History Social History   Tobacco Use  . Smoking status: Never Smoker  . Smokeless tobacco: Never Used  Substance Use Topics  . Alcohol use: Yes    Comment: socially   . Drug use: No     Allergies   Patient has no known allergies.   Review of Systems  Review of Systems  Constitutional: Negative for activity change, appetite change, fatigue and fever.  HENT: Negative.   Gastrointestinal: Negative for abdominal distention, abdominal pain, diarrhea, nausea and vomiting.  Genitourinary: Positive for dysuria and vaginal discharge. Negative for decreased urine volume, dyspareunia, flank pain, frequency, genital sores, pelvic pain and vaginal bleeding.  Musculoskeletal: Negative.   Neurological: Negative for dizziness, weakness and headaches.     Physical Exam Triage Vital Signs ED Triage Vitals  Enc Vitals Group     BP 03/06/19 1558 118/68     Pulse Rate 03/06/19 1558 86     Resp 03/06/19 1558 18     Temp 03/06/19 1558 97.8 F (36.6 C)     Temp Source 03/06/19 1558 Oral     SpO2 03/06/19 1558 99 %     Weight --      Height --      Head Circumference --      Peak Flow --      Pain Score 03/06/19 1559 3     Pain Loc --      Pain Edu? --      Excl. in [place]? --    No data found.  Updated Vital Signs BP 118/68 (BP Location: Right Arm)   Pulse 86   Temp 97.8 F (  36.6 C) (Oral)   Resp 18   SpO2 99%   Visual Acuity Right Eye Distance:   Left Eye Distance:   Bilateral Distance:    Right Eye Near:   Left Eye Near:    Bilateral Near:     Physical Exam Constitutional:      Appearance: Normal appearance. She is not ill-appearing or toxic-appearing.  Cardiovascular:     Rate and Rhythm: Normal rate and regular rhythm.     Pulses: Normal pulses.     Heart sounds: Normal heart sounds.  Pulmonary:     Effort: Pulmonary effort is normal.     Breath sounds: Normal breath sounds.  Abdominal:     General: Bowel sounds are normal. There is no distension.     Palpations: Abdomen is soft.     Tenderness: There is no abdominal tenderness. There is no guarding.  Musculoskeletal: Normal range of motion.        General: No swelling, deformity or signs of injury.  Skin:    General: Skin is warm.     Capillary Refill: Capillary  refill takes less than 2 seconds.     Coloration: Skin is not jaundiced.     Findings: No bruising or lesion.  Neurological:     General: No focal deficit present.     Mental Status: She is alert and oriented to person, place, and time.      UC Treatments / Results  Labs (all labs ordered are listed, but only abnormal results are displayed) Labs Reviewed  URINE CYTOLOGY ANCILLARY ONLY    EKG None  Radiology No results found.  Procedures Procedures (including critical care time)  Medications Ordered in UC Medications - No data to display  Initial Impression / Assessment and Plan / UC Course  I have reviewed the triage vital signs and the nursing notes.  Pertinent labs & imaging results that were available during my care of the patient were reviewed by me and considered in my medical decision making (see chart for details).     1.  Vaginal yeast infection: Fluconazole 200 mg now, may be repeated in 72 hours if there is no improvement.  2.  Potential STD exposure: Point-of-care urinalysis is negative for urinary tract infection Urine cytology for GC chlamydia, trichomonas. Safe sex counseling given to patient. If patient develops any lower abdominal pain worsening vaginal discharge, fever or chills or flank pain she is advised to return to urgent care to be reevaluated. Final Clinical Impressions(s) / UC Diagnoses   Final diagnoses:  Vaginal yeast infection  Possible exposure to STD   Discharge Instructions   None    ED Prescriptions    Medication Sig Dispense Auth. Provider   fluconazole (DIFLUCAN) 150 MG tablet Take one today and take another tablet in 3 days if no improvement. 2 tablet Moishy Laday, Britta MccreedyPhilip O, MD     Controlled Substance Prescriptions [place] Controlled Substance Registry consulted? No   Merrilee JanskyLamptey, Keajah Killough O, MD 03/07/19 [phone]

## 2019-03-06 NOTE — ED Triage Notes (Signed)
Pt here for dysuria and STD screening; pt sts some vaginal discharge

## 2019-03-07 LAB — URINE CYTOLOGY ANCILLARY ONLY
Chlamydia: NEGATIVE
Neisseria Gonorrhea: NEGATIVE
Trichomonas: NEGATIVE

## 2019-03-08 LAB — URINE CYTOLOGY ANCILLARY ONLY: Candida vaginitis: NEGATIVE

## 2019-05-07 ENCOUNTER — Ambulatory Visit (HOSPITAL_COMMUNITY)
Admission: EM | Admit: 2019-05-07 | Discharge: 2019-05-07 | Disposition: A | Discharge status: Home / Self Care | Payer: BLUE CROSS/BLUE SHIELD | Attending: Family Medicine | Admitting: Family Medicine

## 2019-05-07 ENCOUNTER — Other Ambulatory Visit: Payer: Self-pay

## 2019-05-07 ENCOUNTER — Encounter (HOSPITAL_COMMUNITY): Payer: Self-pay

## 2019-05-07 DIAGNOSIS — B9789 Other viral agents as the cause of diseases classified elsewhere: Secondary | ICD-10-CM | POA: Insufficient documentation

## 2019-05-07 DIAGNOSIS — Z20828 Contact with and (suspected) exposure to other viral communicable diseases: Secondary | ICD-10-CM | POA: Insufficient documentation

## 2019-05-07 DIAGNOSIS — Z1159 Encounter for screening for other viral diseases: Secondary | ICD-10-CM | POA: Diagnosis not present

## 2019-05-07 DIAGNOSIS — J069 Acute upper respiratory infection, unspecified: Secondary | ICD-10-CM | POA: Insufficient documentation

## 2019-05-07 DIAGNOSIS — R05 Cough: Secondary | ICD-10-CM | POA: Diagnosis not present

## 2019-05-07 MED ORDER — FLUTICASONE PROPIONATE 50 MCG/ACT NA SUSP: 1.0000 | Freq: Every day | NASAL | 0 refills | Status: DC

## 2019-05-07 MED ORDER — BENZONATATE 200 MG PO CAPS: 200.0000 mg | ORAL_CAPSULE | Freq: Three times a day (TID) | ORAL | 0 refills | Status: AC | PRN

## 2019-05-07 MED ORDER — CETIRIZINE HCL 10 MG PO CAPS: 10.0000 mg | ORAL_CAPSULE | Freq: Every day | ORAL | 0 refills | Status: DC

## 2019-05-07 NOTE — ED Triage Notes (Signed)
Pt states she has been sick 2 days. Headache and throat pain its better now.pt wants to be tested.

## 2019-05-07 NOTE — Discharge Instructions (Signed)
Symptoms should resolve on their own over the next 1 to 2 weeks. Covid test pending.  We will call if results are positive, otherwise please monitor my chart. Begin daily cetirizine to help with congestion and drainage Flonase nasal spray 1 to 2 sprays in each nostril daily Tessalon as needed for cough every 8 hours Please follow-up if symptoms not resolving or worsening, getting shortness of breath, difficulty breathing or fevers

## 2019-05-07 NOTE — ED Provider Notes (Signed)
[place]    CSN: [id number] Arrival date & time: 05/07/19  1742      History   Chief Complaint Chief Complaint  Patient presents with  . covid test    HPI Joyce Hernandez is a 23 y.o. female no significant past medical history presenting today for evaluation of cough and congestion.  Patient states that Sunday night she began to feel sick.  Initially had sore throat, but this is resolved as she after she took Robitussin, TheraFlu and some vitamins.  She continues to have cough and congestion.  Denies any fevers chills or body aches.  Denies known exposure to COVID.  Denies close sick contacts.  Denies chest pain or shortness of breath.  Resenting as she would like to have the testing.  HPI  History reviewed. No pertinent past medical history.  There are no active problems to display for this patient.   Past Surgical History:  Procedure Laterality Date  . BREAST SURGERY     lymph node removal    OB History   No obstetric history on file.      Home Medications    Prior to Admission medications   Medication Sig Start Date End Date Taking? Authorizing Provider  acetaminophen (TYLENOL) 500 MG tablet Take 500 mg by mouth 2 (two) times daily as needed for moderate pain or headache.    [provider]  benzonatate (TESSALON) 200 MG capsule Take 1 capsule (200 mg total) by mouth 3 (three) times daily as needed for up to 7 days for cough. 05/07/19 05/14/19  ,  C, PA-C  Cetirizine HCl 10 MG CAPS Take 1 capsule (10 mg total) by mouth daily for 10 days. 05/07/19 05/17/19  ,  C, PA-C  fluconazole (DIFLUCAN) 150 MG tablet Take one today and take another tablet in 3 days if no improvement. 03/06/19   Chase Picket, MD  fluticasone (FLONASE) 50 MCG/ACT nasal spray Place 1-2 sprays into both nostrils daily for 7 days. 05/07/19 05/14/19  ,  C, PA-C  omeprazole (PRILOSEC) 20 MG capsule Take 1 capsule (20 mg total) by mouth daily.  03/30/18   Raylene Everts, MD    Family History Family History  Problem Relation Age of Onset  . Healthy Mother   . Healthy Father     Social History Social History   Tobacco Use  . Smoking status: Never Smoker  . Smokeless tobacco: Never Used  Substance Use Topics  . Alcohol use: Yes    Comment: socially   . Drug use: No     Allergies   Patient has no known allergies.   Review of Systems Review of Systems  Constitutional: Negative for activity change, appetite change, chills, fatigue and fever.  HENT: Positive for congestion and rhinorrhea. Negative for ear pain, sinus pressure, sore throat and trouble swallowing.   Eyes: Negative for discharge and redness.  Respiratory: Positive for cough. Negative for chest tightness and shortness of breath.   Cardiovascular: Negative for chest pain.  Gastrointestinal: Negative for abdominal pain, diarrhea, nausea and vomiting.  Musculoskeletal: Negative for myalgias.  Skin: Negative for rash.  Neurological: Negative for dizziness, light-headedness and headaches.     Physical Exam Triage Vital Signs ED Triage Vitals  Enc Vitals Group     BP 05/07/19 1830 126/75     Pulse Rate 05/07/19 1830 82     Resp 05/07/19 1830 16     Temp 05/07/19 1830 98.8 F (37.1 C)  Temp Source 05/07/19 1830 Oral     SpO2 05/07/19 1830 100 %     Weight 05/07/19 1827 149 lb (67.6 kg)     Height --      Head Circumference --      Peak Flow --      Pain Score 05/07/19 1827 0     Pain Loc --      Pain Edu? --      Excl. in [place]? --    No data found.  Updated Vital Signs BP 126/75 (BP Location: Right Arm)   Pulse 82   Temp 98.8 F (37.1 C) (Oral)   Resp 16   Wt 149 lb (67.6 kg)   LMP 04/15/2019   SpO2 100%   BMI 29.10 kg/m   Visual Acuity Right Eye Distance:   Left Eye Distance:   Bilateral Distance:    Right Eye Near:   Left Eye Near:    Bilateral Near:     Physical Exam Vitals signs and nursing note reviewed.   Constitutional:      General: She is not in acute distress.    Appearance: She is well-developed.  HENT:     Head: Normocephalic and atraumatic.     Ears:     Comments: Bilateral ears without tenderness to palpation of external auricle, tragus and mastoid, EAC's without erythema or swelling, TM's with good bony landmarks and cone of light. Non erythematous.     Nose:     Comments: Nasal mucosa erythematous, swollen turbinates bilaterally, no rhinorrhea    Mouth/Throat:     Comments: Oral mucosa pink and moist, no tonsillar enlargement or exudate. Posterior pharynx patent and nonerythematous, no uvula deviation or swelling. Normal phonation. Eyes:     Conjunctiva/sclera: Conjunctivae normal.  Neck:     Musculoskeletal: Neck supple.  Cardiovascular:     Rate and Rhythm: Normal rate and regular rhythm.     Heart sounds: No murmur.  Pulmonary:     Effort: Pulmonary effort is normal. No respiratory distress.     Breath sounds: Normal breath sounds.     Comments: Breathing comfortably at rest, CTABL, no wheezing, rales or other adventitious sounds auscultated Abdominal:     Palpations: Abdomen is soft.     Tenderness: There is no abdominal tenderness.  Skin:    General: Skin is warm and dry.  Neurological:     Mental Status: She is alert.      UC Treatments / Results  Labs (all labs ordered are listed, but only abnormal results are displayed) Labs Reviewed  NOVEL CORONAVIRUS, NAA (HOSPITAL ORDER, SEND-OUT TO REF LAB)    EKG   Radiology No results found.  Procedures Procedures (including critical care time)  Medications Ordered in UC Medications - No data to display  Initial Impression / Assessment and Plan / UC Course  I have reviewed the triage vital signs and the nursing notes.  Pertinent labs & imaging results that were available during my care of the patient were reviewed by me and considered in my medical decision making (see chart for details).     URI  symptoms x2 to 3 days, vital signs stable.  Lungs clear to auscultation.  Likely viral, COVID swab pending.  Recommending symptomatic and supportive care.  Recommendations below.  Work note provided for her to return pending negative COVID testing.Discussed strict return precautions. Patient verbalized understanding and is agreeable with plan.  Final Clinical Impressions(s) / UC Diagnoses   Final diagnoses:  Viral URI with cough     Discharge Instructions     Symptoms should resolve on their own over the next 1 to 2 weeks. Covid test pending.  We will call if results are positive, otherwise please monitor my chart. Begin daily cetirizine to help with congestion and drainage Flonase nasal spray 1 to 2 sprays in each nostril daily Tessalon as needed for cough every 8 hours Please follow-up if symptoms not resolving or worsening, getting shortness of breath, difficulty breathing or fevers    ED Prescriptions    Medication Sig Dispense Auth. Provider   fluticasone (FLONASE) 50 MCG/ACT nasal spray Place 1-2 sprays into both nostrils daily for 7 days. 1 g ,  C, PA-C   Cetirizine HCl 10 MG CAPS Take 1 capsule (10 mg total) by mouth daily for 10 days. 10 capsule ,  C, PA-C   benzonatate (TESSALON) 200 MG capsule Take 1 capsule (200 mg total) by mouth 3 (three) times daily as needed for up to 7 days for cough. 28 capsule ,  C, PA-C     Controlled Substance Prescriptions [place] Controlled Substance Registry consulted? Not Applicable   Lew Dawes,  C, [place]PA-C 05/07/19 1946

## 2019-05-09 LAB — NOVEL CORONAVIRUS, NAA (HOSP ORDER, SEND-OUT TO REF LAB; TAT 18-24 HRS): SARS-CoV-2, NAA: NOT DETECTED

## 2019-05-13 ENCOUNTER — Encounter (HOSPITAL_COMMUNITY): Payer: Self-pay

## 2019-05-24 ENCOUNTER — Ambulatory Visit (HOSPITAL_COMMUNITY)
Admission: EM | Admit: 2019-05-24 | Discharge: 2019-05-24 | Disposition: A | Discharge status: Home / Self Care | Payer: BLUE CROSS/BLUE SHIELD | Attending: Family Medicine | Admitting: Family Medicine

## 2019-05-24 ENCOUNTER — Encounter (HOSPITAL_COMMUNITY): Payer: Self-pay

## 2019-05-24 ENCOUNTER — Other Ambulatory Visit: Payer: Self-pay

## 2019-05-24 DIAGNOSIS — B9689 Other specified bacterial agents as the cause of diseases classified elsewhere: Secondary | ICD-10-CM | POA: Diagnosis not present

## 2019-05-24 DIAGNOSIS — Z113 Encounter for screening for infections with a predominantly sexual mode of transmission: Secondary | ICD-10-CM

## 2019-05-24 DIAGNOSIS — N760 Acute vaginitis: Secondary | ICD-10-CM | POA: Diagnosis not present

## 2019-05-24 MED ORDER — FLUCONAZOLE 150 MG PO TABS: 150.0000 mg | ORAL_TABLET | Freq: Every day | ORAL | 0 refills | Status: DC

## 2019-05-24 MED ORDER — METRONIDAZOLE 500 MG PO TABS: 500.0000 mg | ORAL_TABLET | Freq: Two times a day (BID) | ORAL | 0 refills | Status: DC

## 2019-05-24 NOTE — ED Triage Notes (Signed)
Pt presents with vaginal discharge and and vaginal odor X 2 days.

## 2019-05-24 NOTE — ED Provider Notes (Signed)
[place]    CSN: [id number] Arrival date & time: 05/24/19  1756      History   Chief Complaint Chief Complaint  Patient presents with  . Vaginitis    HPI Joyce Hernandez is a 23 y.o. female.   HPI  Patient has recurrent BV.  She has had a vaginal discharge and odor for 2 days.  She recognizes this is consistent with her prior episodes of BV.  She does not have any dysuria.  No pelvic pain.  No fever or chills.  No nausea or vomiting.  She does not have any concern for STD  History reviewed. No pertinent past medical history.  There are no active problems to display for this patient.   Past Surgical History:  Procedure Laterality Date  . BREAST SURGERY     lymph node removal    OB History   No obstetric history on file.      Home Medications    Prior to Admission medications   Medication Sig Start Date End Date Taking? Authorizing Provider  acetaminophen (TYLENOL) 500 MG tablet Take 500 mg by mouth 2 (two) times daily as needed for moderate pain or headache.    [provider]  Cetirizine HCl 10 MG CAPS Take 1 capsule (10 mg total) by mouth daily for 10 days. 05/07/19 05/17/19  Wieters, Hallie C, PA-C  fluconazole (DIFLUCAN) 150 MG tablet Take 1 tablet (150 mg total) by mouth daily. Repeat in 1 week if needed 05/24/19   Raylene Everts, MD  fluticasone [facility]) 50 MCG/ACT nasal spray Place 1-2 sprays into both nostrils daily for 7 days. 05/07/19 05/14/19  Wieters, Hallie C, PA-C  metroNIDAZOLE (FLAGYL) 500 MG tablet Take 1 tablet (500 mg total) by mouth 2 (two) times daily. 05/24/19   Raylene Everts, MD  omeprazole (PRILOSEC) 20 MG capsule Take 1 capsule (20 mg total) by mouth daily. 03/30/18   Raylene Everts, MD    Family History Family History  Problem Relation Age of Onset  . Healthy Mother   . Healthy Father     Social History Social History   Tobacco Use  . Smoking status: Never Smoker  . Smokeless tobacco: Never Used   Substance Use Topics  . Alcohol use: Yes    Comment: socially   . Drug use: No     Allergies   Patient has no known allergies.   Review of Systems Review of Systems  Constitutional: Negative for chills and fever.  HENT: Negative for ear pain and sore throat.   Eyes: Negative for pain and visual disturbance.  Respiratory: Negative for cough and shortness of breath.   Cardiovascular: Negative for chest pain and palpitations.  Gastrointestinal: Negative for abdominal pain and vomiting.  Genitourinary: Positive for vaginal discharge. Negative for dysuria and hematuria.  Musculoskeletal: Negative for arthralgias and back pain.  Skin: Negative for color change and rash.  Neurological: Negative for seizures and syncope.  All other systems reviewed and are negative.    Physical Exam Triage Vital Signs ED Triage Vitals  Enc Vitals Group     BP 05/24/19 1912 117/74     Pulse Rate 05/24/19 1912 73     Resp 05/24/19 1912 18     Temp 05/24/19 1912 98.2 F (36.8 C)     Temp Source 05/24/19 1912 Oral     SpO2 05/24/19 1912 100 %     Weight --      Height --  Head Circumference --      Peak Flow --      Pain Score 05/24/19 1914 0     Pain Loc --      Pain Edu? --      Excl. in [place]? --    No data found.  Updated Vital Signs BP 117/74 (BP Location: Left Arm)   Pulse 73   Temp 98.2 F (36.8 C) (Oral)   Resp 18   LMP 05/13/2019   SpO2 100%   Visual Acuity Right Eye Distance:   Left Eye Distance:   Bilateral Distance:    Right Eye Near:   Left Eye Near:    Bilateral Near:     Physical Exam Constitutional:      General: She is not in acute distress.    Appearance: She is well-developed.  HENT:     Head: Normocephalic and atraumatic.  Eyes:     Conjunctiva/sclera: Conjunctivae normal.     Pupils: Pupils are equal, round, and reactive to light.  Neck:     Musculoskeletal: Normal range of motion.  Cardiovascular:     Rate and Rhythm: Normal rate.  Pulmonary:      Effort: Pulmonary effort is normal. No respiratory distress.  Chest:     Chest wall: No tenderness.  Abdominal:     General: There is no distension.     Palpations: Abdomen is soft.     Tenderness: There is left CVA tenderness. There is no right CVA tenderness.  Musculoskeletal: Normal range of motion.  Skin:    General: Skin is warm and dry.  Neurological:     Mental Status: She is alert.  Psychiatric:        Mood and Affect: Mood normal.        Behavior: Behavior normal.      UC Treatments / Results  Labs (all labs ordered are listed, but only abnormal results are displayed) Labs Reviewed  CERVICOVAGINAL ANCILLARY ONLY    EKG   Radiology No results found.  Procedures Procedures (including critical care time)  Medications Ordered in UC Medications - No data to display  Initial Impression / Assessment and Plan / UC Course  I have reviewed the triage vital signs and the nursing notes.  Pertinent labs & imaging results that were available during my care of the patient were reviewed by me and considered in my medical decision making (see chart for details).     Discussed recurring BV.  Prevention.  On further conversation she does agree to have STD testing.  She is not having symptoms of STD. Final Clinical Impressions(s) / UC Diagnoses   Final diagnoses:  BV (bacterial vaginosis)     Discharge Instructions     Take metronidazole 2 pills a day for 7 days Take the Diflucan for yeast infection.  Take 1 today and then take 1 in 7 days when you are finished with the metronidazole I recommend taking a women's probiotic daily to try to prevent recurring BV   ED Prescriptions    Medication Sig Dispense Auth. Provider   metroNIDAZOLE (FLAGYL) 500 MG tablet Take 1 tablet (500 mg total) by mouth 2 (two) times daily. 14 tablet Eustace MooreNelson, Letisia Schwalb Sue, MD   fluconazole (DIFLUCAN) 150 MG tablet Take 1 tablet (150 mg total) by mouth daily. Repeat in 1 week if needed 2  tablet Eustace MooreNelson, Bret Vanessen Sue, MD     Controlled Substance Prescriptions [place] Controlled Substance Registry consulted? 0   Eustace MooreNelson, Tonisha Silvey Sue, MD  05/24/19 2100  

## 2019-05-24 NOTE — Discharge Instructions (Addendum)
Take metronidazole 2 pills a day for 7 days Take the Diflucan for yeast infection.  Take 1 today and then take 1 in 7 days when you are finished with the metronidazole I recommend taking a women's probiotic daily to try to prevent recurring BV

## 2019-05-29 LAB — CERVICOVAGINAL ANCILLARY ONLY
Chlamydia: NEGATIVE
Neisseria Gonorrhea: NEGATIVE
Trichomonas: NEGATIVE

## 2019-07-13 ENCOUNTER — Other Ambulatory Visit: Payer: Self-pay

## 2019-07-13 ENCOUNTER — Encounter (HOSPITAL_COMMUNITY): Payer: Self-pay | Admitting: *Deleted

## 2019-07-13 ENCOUNTER — Ambulatory Visit (HOSPITAL_COMMUNITY)
Admission: EM | Admit: 2019-07-13 | Discharge: 2019-07-13 | Disposition: A | Discharge status: Home / Self Care | Payer: BLUE CROSS/BLUE SHIELD | Attending: Family Medicine | Admitting: Family Medicine

## 2019-07-13 DIAGNOSIS — R300 Dysuria: Secondary | ICD-10-CM | POA: Insufficient documentation

## 2019-07-13 DIAGNOSIS — N898 Other specified noninflammatory disorders of vagina: Secondary | ICD-10-CM | POA: Insufficient documentation

## 2019-07-13 LAB — POCT URINALYSIS DIP (DEVICE)
Bilirubin Urine: NEGATIVE
Glucose, UA: NEGATIVE mg/dL
Hgb urine dipstick: NEGATIVE
Ketones, ur: NEGATIVE mg/dL
Nitrite: NEGATIVE
Protein, ur: NEGATIVE mg/dL
Specific Gravity, Urine: >=1.03 (ref 1.005–1.030)
Urobilinogen, UA: 0.2 mg/dL (ref 0.0–1.0)
pH: 6 (ref 5.0–8.0)

## 2019-07-13 LAB — HIV ANTIBODY (ROUTINE TESTING W REFLEX): HIV Screen 4th Generation wRfx: NONREACTIVE

## 2019-07-13 MED ORDER — CEFTRIAXONE SODIUM 250 MG IJ SOLR
250.0000 mg | Freq: Once | INTRAMUSCULAR | Status: AC
  Administered 2019-07-13: 12:00:00 250 mg via INTRAMUSCULAR

## 2019-07-13 MED ORDER — LIDOCAINE HCL (PF) 1 % IJ SOLN
INTRAMUSCULAR | Status: AC
  Filled 2019-07-13: qty 2

## 2019-07-13 MED ORDER — CEFTRIAXONE SODIUM 250 MG IJ SOLR
INTRAMUSCULAR | Status: AC
  Filled 2019-07-13: qty 250

## 2019-07-13 MED ORDER — AZITHROMYCIN 250 MG PO TABS
1000.0000 mg | ORAL_TABLET | Freq: Once | ORAL | Status: AC
  Administered 2019-07-13: 12:00:00 1000 mg via ORAL

## 2019-07-13 MED ORDER — AZITHROMYCIN 250 MG PO TABS
ORAL_TABLET | ORAL | Status: AC
  Filled 2019-07-13: qty 4

## 2019-07-13 NOTE — ED Triage Notes (Signed)
C/O vaginal discharge x 2 days without any pain.

## 2019-07-13 NOTE — ED Provider Notes (Signed)
[facility]   [id number] 07/13/19 Arrival Time: 1023  ASSESSMENT & PLAN:  1. Vaginal discharge   2. Dysuria       Discharge Instructions     You have been given the following medications today for treatment of suspected gonorrhea and/or chlamydia:  cefTRIAXone (ROCEPHIN) injection 250 mg azithromycin (ZITHROMAX) tablet 1,000 mg  Even though we have treated you today, we have sent testing for sexually transmitted infections. We will notify you of any positive results once they are received. If required, we will prescribe any medications you might need.  Please refrain from all sexual activity for at least the next seven days.     Pending: Labs Reviewed  URINE CULTURE  RPR  HIV ANTIBODY (ROUTINE TESTING W REFLEX)  CERVICOVAGINAL ANCILLARY ONLY    Will notify of any positive results. Instructed to refrain from sexual activity for at least seven days.  Reviewed expectations re: course of current medical issues. Questions answered. Outlined signs and symptoms indicating need for more acute intervention. Patient verbalized understanding. After Visit Summary given.   SUBJECTIVE:  Joyce Hernandez is a 23 y.o. female who presents with complaint of vaginal discharge. Onset abrupt. First noticed 2 d ago. Describes discharge as thick and "yellowish". Denies: hematuria, chills, sweats and urinary incontinence. Does report mild dysuria noted today. Afebrile. No abdominal or pelvic pain. Normal PO intake wihout n/v. No rashes or lesions. Reports that she is sexually active with single female partner. OTC treatment: none. Desires STI testing. History of STI: none reported. Treated for BV on 05/24/2019; "this feels different".  Patient's last menstrual period was 07/01/2019 (exact date).  ROS: As per HPI. All other systems negative.   OBJECTIVE:  Vitals:   07/13/19 1116  BP: 116/82  Pulse: 64  Resp: 16  Temp: 98.5 F (36.9 C)  TempSrc: Oral  SpO2: 100%      General appearance: alert, cooperative, appears stated age and no distress Throat: lips, mucosa, and tongue normal; teeth and gums normal CV: RRR Lungs: CTAB Back: no CVA tenderness; FROM at waist Abdomen: soft, non-tender GU: deferred Skin: warm and dry Psychological: alert and cooperative; normal mood and affect.  Results for orders placed or performed during the hospital encounter of 05/24/19  Cervicovaginal ancillary only  Result Value Ref Range   Chlamydia Negative    Neisseria Gonorrhea Negative    Trichomonas Negative     Labs Reviewed  RPR  HIV ANTIBODY (ROUTINE TESTING W REFLEX)  CERVICOVAGINAL ANCILLARY ONLY    No Known Allergies  PMH: BV; GERD.  Family History  Problem Relation Age of Onset  . Healthy Mother   . Healthy Father    Social History   Socioeconomic History  . Marital status: Single    Spouse name: Not on file  . Number of children: Not on file  . Years of education: Not on file  . Highest education level: Not on file  Occupational History  . Not on file  Social Needs  . Financial resource strain: Not on file  . Food insecurity    Worry: Not on file    Inability: Not on file  . Transportation needs    Medical: Not on file    Non-medical: Not on file  Tobacco Use  . Smoking status: Never Smoker  . Smokeless tobacco: Never Used  Substance and Sexual Activity  . Alcohol use: Yes    Comment: socially   . Drug use: No  . Sexual activity: Yes  Birth control/protection: None  Lifestyle  . Physical activity    Days per week: Not on file    Minutes per session: Not on file  . Stress: Not on file  Relationships  . Social Herbalist on phone: Not on file    Gets together: Not on file    Attends religious service: Not on file    Active member of club or organization: Not on file    Attends meetings of clubs or organizations: Not on file    Relationship status: Not on file  . Intimate partner violence    Fear of current  or ex partner: Not on file    Emotionally abused: Not on file    Physically abused: Not on file    Forced sexual activity: Not on file  Other Topics Concern  . Not on file  Social History Narrative  . Not on file          Vanessa Kick, MD 07/13/19 1339

## 2019-07-13 NOTE — Discharge Instructions (Signed)

## 2019-07-14 LAB — RPR: RPR Ser Ql: NONREACTIVE

## 2019-07-17 LAB — URINE CULTURE: Culture: 50000 — AB

## 2019-07-18 LAB — CERVICOVAGINAL ANCILLARY ONLY
Bacterial vaginitis: NEGATIVE
Candida vaginitis: NEGATIVE
Chlamydia: NEGATIVE
Neisseria Gonorrhea: NEGATIVE
Trichomonas: NEGATIVE

## 2019-07-25 ENCOUNTER — Other Ambulatory Visit: Payer: Self-pay

## 2019-07-25 ENCOUNTER — Ambulatory Visit (INDEPENDENT_AMBULATORY_CARE_PROVIDER_SITE_OTHER): Payer: BLUE CROSS/BLUE SHIELD

## 2019-07-25 VITALS — BP 124/78 | HR 80 | Ht 60.0 in | Wt 143.0 lb

## 2019-07-25 DIAGNOSIS — Z0000 Encounter for general adult medical examination without abnormal findings: Secondary | ICD-10-CM

## 2019-07-25 DIAGNOSIS — Z124 Encounter for screening for malignant neoplasm of cervix: Secondary | ICD-10-CM | POA: Diagnosis not present

## 2019-07-25 DIAGNOSIS — Z01419 Encounter for gynecological examination (general) (routine) without abnormal findings: Secondary | ICD-10-CM

## 2019-07-25 DIAGNOSIS — Z30011 Encounter for initial prescription of contraceptive pills: Secondary | ICD-10-CM

## 2019-07-25 MED ORDER — LEVONORGEST-ETH ESTRAD 91-DAY 0.15-0.03 &0.01 MG PO TABS: 1.0000 | ORAL_TABLET | Freq: Every day | ORAL | 4 refills | Status: DC

## 2019-07-25 NOTE — Patient Instructions (Signed)
Oral Contraception Use Oral contraceptive pills (OCPs) are medicines that you take to prevent pregnancy. OCPs work by:  Preventing the ovaries from releasing eggs.  Thickening mucus in the lower part of the uterus (cervix), which prevents sperm from entering the uterus.  Thinning the lining of the uterus (endometrium), which prevents a fertilized egg from attaching to the endometrium. OCPs are highly effective when taken exactly as prescribed. However, OCPs do not prevent sexually transmitted infections (STIs). Safe sex practices, such as using condoms while on an OCP, can help prevent STIs. Before taking OCPs, you may have a physical exam, blood test, and Pap test. A Pap test involves taking a sample of cells from your cervix to check for cancer. Discuss with your health care provider the possible side effects of the OCP you may be prescribed. When you start an OCP, be aware that it can take 2-3 months for your body to adjust to changes in hormone levels. How to take oral contraceptive pills Follow instructions from your health care provider about how to start taking your first cycle of OCPs. Your health care provider may recommend that you:  Start the pill on day 1 of your menstrual period. If you start at this time, you will not need any backup form of birth control (contraception), such as condoms.  Start the pill on the first Sunday after your menstrual period or on the day you get your prescription. In these cases, you will need to use backup contraception for the first week.  Start the pill at any time of your cycle. ? If you take the pill within 5 days of the start of your period, you will not need a backup form of contraception. ? If you start at any other time of your menstrual cycle, you will need to use another form of contraception for 7 days. If your OCP is the type called a minipill, it will protect you from pregnancy after taking it for 2 days (48 hours), and you can stop using  backup contraception after that time. After you have started taking OCPs:  If you forget to take 1 pill, take it as soon as you remember. Take the next pill at the regular time.  If you miss 2 or more pills, call your health care provider. Different pills have different instructions for missed doses. Use backup birth control until your next menstrual period starts.  If you use a 28-day pack that contains inactive pills and you miss 1 of the last 7 pills (pills with no hormones), throw away the rest of the non-hormone pills and start a new pill pack. No matter which day you start the OCP, you will always start a new pack on that same day of the week. Have an extra pack of OCPs and a backup contraceptive method available in case you miss some pills or lose your OCP pack. Follow these instructions at home:  Do not use any products that contain nicotine or tobacco, such as cigarettes and e-cigarettes. If you need help quitting, ask your health care provider.  Always use a condom to protect against STIs. OCPs do not protect against STIs.  Use a calendar to mark the days of your menstrual period.  Read the information and directions that came with your OCP. Talk to your health care provider if you have questions. Contact a health care provider if:  You develop nausea and vomiting.  You have abnormal vaginal discharge or bleeding.  You develop a rash.    You miss your menstrual period. Depending on the type of OCP you are taking, this may be a sign of pregnancy. Ask your health care provider for more information.  You are losing your hair.  You need treatment for mood swings or depression.  You get dizzy when taking the OCP.  You develop acne after taking the OCP.  You become pregnant or think you may be pregnant.  You have diarrhea, constipation, and abdominal pain or cramps.  You miss 2 or more pills. Get help right away if:  You develop chest pain.  You develop shortness of  breath.  You have an uncontrolled or severe headache.  You develop numbness or slurred speech.  You develop visual or speech problems.  You develop pain, redness, and swelling in your legs.  You develop weakness or numbness in your arms or legs. Summary  Oral contraceptive pills (OCPs) are medicines that you take to prevent pregnancy.  OCPs do not prevent sexually transmitted infections (STIs). Always use a condom to protect against STIs.  When you start an OCP, be aware that it can take 2-3 months for your body to adjust to changes in hormone levels.  Read all the information and directions that come with your OCP. This information is not intended to replace advice given to you by your health care provider. Make sure you discuss any questions you have with your health care provider. Document Released: 08/25/2011 Document Revised: 12/28/2018 Document Reviewed: 10/17/2016 Elsevier Patient Education  2020 [employer]  

## 2019-07-25 NOTE — Progress Notes (Signed)
GYNECOLOGY OFFICE VISIT NOTE  History:   Joyce Hernandez [id number] here today for annual well woman exam. She reports a desire for birth control initiation and would like to start oral contraception.  She is currently sexually active and is not using any form of birth. She reports that she has normal monthly periods without cramping and normal to moderate flow.  She denies pain or discomfort during sexual activity and has no current vaginal concerns.  She states her last sexual encounter was on Friday and unprotected, but that she used the morning after pill.  She reports daily SBE/A and denies a family history of breast cancer. Patient denies a history of headaches, migraines with aura, or blood pressure issues.  She reports some fear about not having a menses as she was told this is "the body's way of cleaning itself."   Patient does not have a primary care provider due to relocation.  She is a recent graduate from [employer] with a degree in Biology with a  Pre-Med Concentration. She reports regular exercise 3x/week and does a variety of work-ups.  She reports that she has a decreased appetite, due to recent dieting, but feels her nutritional intake is healthy.  She reports occasional alcohol usage while denying tobacco and illicit drug usage.  She endorses safety at home and a good support system.  She denies feeliigns of depression.  She denies DV/A.    History reviewed. No pertinent past medical history.  Past Surgical History:  Procedure Laterality Date  . BREAST SURGERY     lymph node removal    The following portions of the patient's history were reviewed and updated as appropriate: allergies, current medications, past family history, past medical history, past social history, past surgical history and problem list.   Health Maintenance:  No pap history d/t age, but collected today.  No mammogram history d/t age.  Review of Systems:  Pertinent items noted in HPI and  remainder of comprehensive ROS otherwise negative.    Objective:    Physical Exam BP 124/78   Pulse 80   Ht 5' (1.524 m)   Wt 143 lb (64.9 kg)   LMP 07/01/2019 (Exact Date)   BMI 27.93 kg/m  Physical Exam Constitutional:      Appearance: Normal appearance.  HENT:     Head: Normocephalic and atraumatic.  Neck:     Musculoskeletal: Normal range of motion.     Thyroid: No thyroid mass or thyroid tenderness.  Cardiovascular:     Rate and Rhythm: Normal rate and regular rhythm.     Heart sounds: Normal heart sounds.  Pulmonary:     Effort: Pulmonary effort is normal.     Breath sounds: Normal breath sounds.  Chest:     Breasts:        Right: No inverted nipple, mass, nipple discharge, skin change or tenderness.        Left: No inverted nipple, mass, nipple discharge, skin change or tenderness.  Abdominal:     General: Abdomen is flat. Bowel sounds are normal.     Palpations: Abdomen is soft.     Tenderness: There is no abdominal tenderness.  Genitourinary:    Labia:        Right: No tenderness or lesion.        Left: No tenderness or lesion.      Vagina: Normal. No bleeding.     Cervix: No cervical motion tenderness or friability.     Uterus:  Normal. Not fixed and not tender.      Adnexa:        Right: No tenderness.         Left: No tenderness.    Musculoskeletal: Normal range of motion.  Lymphadenopathy:     Upper Body:     Right upper body: No supraclavicular or axillary adenopathy.     Left upper body: No supraclavicular or axillary adenopathy.  Skin:    General: Skin is dry.  Neurological:     Mental Status: She is alert and oriented to person, place, and time.  Psychiatric:        Mood and Affect: Mood normal.        Behavior: Behavior normal.        Thought Content: Thought content normal.      Labs and Imaging No results found for this or any previous visit (from the past 168 hour(s)). No results found.   Assessment & Plan:       1. Encounter for  well woman exam with routine gynecological exam -Exam performed and findings discussed. -Informed of turnover time and provider/clinic policy on releasing results. -Encouraged to activate Mychart. -Educated on AHA exercise recommendations of 30 minutes of moderate to vigorous activity at least 5x/week. -Educated and encouraged to continue SBE with increased breast awareness including examination of breast for skin changes, moles, tenderness, etc.   2. Encounter for initial prescription of contraceptive pills -Reviewed timing, administration, effectiveness, risks, and benefits of oral contraception.  -Educated on menstrual cycle including growth and shedding of endometrial lining.  Discussed how hormones cause cessation of lining growth resulting in no menses.  -Reviewed types of birth control and patient opts for tricycle option. -Rx for Levonorgestrel-Ethinyl Estradiol (AMETHIA) 0.15-0.03 &0.01 MG tablet sent to pharmacy on file.  -Plan to follow up in 3 months for blood pressure check and reassessment.   3. Encounter for screening for cervical cancer  -Educated on ASCCP guidelines regarding pap smear evaluation and frequency.  Routine preventative health maintenance measures emphasized. Please refer to After Visit Summary for other counseling recommendations.   Return in about 3 months (around 10/25/2019) for F/U Pill Start with BP Check.      Maryann Conners, CNM 07/25/2019

## 2019-07-25 NOTE — Progress Notes (Signed)
NGYN patient presents for Annual Exam  Last pap: WNL per pt  LMP: 07/01/19. Lasting 4 days. Monthly  Contraception: None at this time. Has tried Depo/patch  in the past. Pt considering pills.  STD Screening : Declined   Pt denies any Family Hx of Breast Cancer.  Pt states last unprotected intercourse Friday 07/19/19.   *Pt does not want female student in exam room   CC: None

## 2019-07-30 LAB — CYTOLOGY - PAP

## 2019-07-31 ENCOUNTER — Other Ambulatory Visit: Payer: Self-pay

## 2019-07-31 ENCOUNTER — Ambulatory Visit (HOSPITAL_COMMUNITY)
Admission: EM | Admit: 2019-07-31 | Discharge: 2019-07-31 | Disposition: A | Discharge status: Home / Self Care | Payer: BLUE CROSS/BLUE SHIELD | Attending: Family Medicine | Admitting: Family Medicine

## 2019-07-31 ENCOUNTER — Encounter (HOSPITAL_COMMUNITY): Payer: Self-pay

## 2019-07-31 DIAGNOSIS — N898 Other specified noninflammatory disorders of vagina: Secondary | ICD-10-CM | POA: Diagnosis not present

## 2019-07-31 MED ORDER — METRONIDAZOLE 500 MG PO TABS: 500.0000 mg | ORAL_TABLET | Freq: Two times a day (BID) | ORAL | 0 refills | Status: DC

## 2019-07-31 NOTE — ED Provider Notes (Signed)
[facility]   [id number] 07/31/19 Arrival Time: 1214  ASSESSMENT & PLAN:  1. Vaginal discharge     No s/s of PID.  Meds ordered this encounter  Medications  . metroNIDAZOLE (FLAGYL) 500 MG tablet    Sig: Take 1 tablet (500 mg total) by mouth 2 (two) times daily.    Dispense:  14 tablet    Refill:  0    Declines STI testing. Prefers to treat as BV first.   May f/u here if not improving as anticipated.  Reviewed expectations re: course of current medical issues. Questions answered. Outlined signs and symptoms indicating need for more acute intervention. Patient verbalized understanding. After Visit Summary given.   SUBJECTIVE:  Joyce Hernandez is a 23 y.o. female who presents with complaint of vaginal discharge. Onset gradual. First noticed 2 d ago. Describes discharge as thin and clear/white. Denies: urinary frequency, hematuria and dysuria. Afebrile. No abdominal or pelvic pain. Normal PO intake wihout n/v. No rashes or lesions. Reports that she is sexually active with single female partner. OTC treatment: none. History of STI: Reports h/o BV with similar symptoms. Negative vaginal cytology 2-3 weeks ago.  Patient's last menstrual period was 07/01/2019 (exact date).  ROS: As per HPI. All other systems negative.   OBJECTIVE:  Vitals:   07/31/19 1245  BP: 134/66  Pulse: 66  Resp: 16  Temp: 98.9 F (37.2 C)  TempSrc: Oral  SpO2: 100%     General appearance: alert, cooperative, appears stated age and no distress Throat: lips, mucosa, and tongue normal; teeth and gums normal CV: RRR Lungs: CTAB Back: no CVA tenderness; FROM at waist Abdomen: soft, non-tender GU: deferred Skin: warm and dry Psychological: alert and cooperative; normal mood and affect.    Labs Reviewed - No data to display  No Known Allergies  PMH: BV. GERD.  Family History  Problem Relation Age of Onset  . Healthy Mother   . Healthy Father    Social History    Socioeconomic History  . Marital status: Single    Spouse name: Not on file  . Number of children: Not on file  . Years of education: Not on file  . Highest education level: Not on file  Occupational History  . Not on file  Social Needs  . Financial resource strain: Not on file  . Food insecurity    Worry: Not on file    Inability: Not on file  . Transportation needs    Medical: Not on file    Non-medical: Not on file  Tobacco Use  . Smoking status: Never Smoker  . Smokeless tobacco: Never Used  Substance and Sexual Activity  . Alcohol use: Yes    Comment: socially   . Drug use: No  . Sexual activity: Yes    Partners: Male    Birth control/protection: None  Lifestyle  . Physical activity    Days per week: Not on file    Minutes per session: Not on file  . Stress: Not on file  Relationships  . Social Musician on phone: Not on file    Gets together: Not on file    Attends religious service: Not on file    Active member of club or organization: Not on file    Attends meetings of clubs or organizations: Not on file    Relationship status: Not on file  . Intimate partner violence    Fear of current or ex partner: Not on file  Emotionally abused: Not on file    Physically abused: Not on file    Forced sexual activity: Not on file  Other Topics Concern  . Not on file  Social History Narrative  . Not on file          Vanessa Kick, MD 08/01/19 [phone]

## 2019-07-31 NOTE — ED Triage Notes (Signed)
Patient presents to Urgent Care with complaints of odorous vaginal discharge since 2 days ago. Patient reports she has had BV multiple times in the past and thinks she has it again.

## 2019-08-02 ENCOUNTER — Telehealth: Payer: BLUE CROSS/BLUE SHIELD | Admitting: Physician Assistant

## 2019-08-02 DIAGNOSIS — N3000 Acute cystitis without hematuria: Secondary | ICD-10-CM | POA: Diagnosis not present

## 2019-08-02 NOTE — Progress Notes (Signed)
I spent more than 5 min but less than 10 min on this E-Visit  We are sorry that you are not feeling well.  Here is how we plan to help!  Based on what you shared with me it looks like you most likely have a simple urinary tract infection.  A UTI (Urinary Tract Infection) is a bacterial infection of the bladder.  Most cases of urinary tract infections are simple to treat but a key part of your care is to encourage you to drink plenty of fluids and watch your symptoms carefully.  I have prescribed Keflex 500 mg twice a day for 7 days.  Your symptoms should gradually improve. Call us if the burning in your urine worsens, you develop worsening fever, back pain or pelvic pain or if your symptoms do not resolve after completing the antibiotic.  Urinary tract infections can be prevented by drinking plenty of water to keep your body hydrated.  Also be sure when you wipe, wipe from front to back and don't hold it in!  If possible, empty your bladder every 4 hours.  Your e-visit answers were reviewed by a board certified advanced clinical practitioner to complete your personal care plan.  Depending on the condition, your plan could have included both over the counter or prescription medications.  If there is a problem please reply  once you have received a response from your provider.  Your safety is important to us.  If you have drug allergies check your prescription carefully.    You can use MyChart to ask questions about today's visit, request a non-urgent call back, or ask for a work or school excuse for 24 hours related to this e-Visit. If it has been greater than 24 hours you will need to follow up with your provider, or enter a new e-Visit to address those concerns.   You will get an e-mail in the next two days asking about your experience.  I hope that your e-visit has been valuable and will speed your recovery. Thank you for using e-visits.   

## 2019-08-04 ENCOUNTER — Encounter (HOSPITAL_COMMUNITY): Payer: Self-pay

## 2019-08-04 DIAGNOSIS — R87612 Low grade squamous intraepithelial lesion on cytologic smear of cervix (LGSIL): Secondary | ICD-10-CM | POA: Insufficient documentation

## 2019-08-13 ENCOUNTER — Ambulatory Visit: Payer: BLUE CROSS/BLUE SHIELD

## 2019-08-29 ENCOUNTER — Other Ambulatory Visit: Payer: Self-pay

## 2019-08-29 ENCOUNTER — Inpatient Hospital Stay (HOSPITAL_COMMUNITY): Admit: 2019-08-29 | Payer: BLUE CROSS/BLUE SHIELD

## 2019-08-29 ENCOUNTER — Ambulatory Visit (INDEPENDENT_AMBULATORY_CARE_PROVIDER_SITE_OTHER): Payer: BLUE CROSS/BLUE SHIELD

## 2019-08-29 DIAGNOSIS — N898 Other specified noninflammatory disorders of vagina: Secondary | ICD-10-CM

## 2019-08-29 DIAGNOSIS — N760 Acute vaginitis: Secondary | ICD-10-CM

## 2019-08-29 DIAGNOSIS — Z113 Encounter for screening for infections with a predominantly sexual mode of transmission: Secondary | ICD-10-CM | POA: Diagnosis not present

## 2019-08-29 DIAGNOSIS — B9689 Other specified bacterial agents as the cause of diseases classified elsewhere: Secondary | ICD-10-CM

## 2019-08-29 NOTE — Progress Notes (Signed)
Pt is in the office for self swab. Pt reports vaginal odor, discharge, wants std testing today.

## 2019-08-30 LAB — CERVICOVAGINAL ANCILLARY ONLY
Bacterial Vaginitis (gardnerella): POSITIVE — AB
Candida Glabrata: NEGATIVE
Candida Vaginitis: NEGATIVE
Chlamydia: NEGATIVE
Comment: NEGATIVE
Comment: NEGATIVE
Comment: NEGATIVE
Comment: NEGATIVE
Comment: NEGATIVE
Comment: NORMAL
Neisseria Gonorrhea: NEGATIVE
Trichomonas: NEGATIVE

## 2019-09-02 ENCOUNTER — Encounter: Payer: Self-pay | Admitting: *Deleted

## 2019-09-02 ENCOUNTER — Ambulatory Visit (HOSPITAL_COMMUNITY)
Admission: EM | Admit: 2019-09-02 | Discharge: 2019-09-02 | Disposition: A | Discharge status: Home / Self Care | Payer: HRSA Program | Attending: Family Medicine | Admitting: Family Medicine

## 2019-09-02 ENCOUNTER — Other Ambulatory Visit: Payer: Self-pay | Admitting: *Deleted

## 2019-09-02 ENCOUNTER — Other Ambulatory Visit: Payer: Self-pay

## 2019-09-02 ENCOUNTER — Encounter (HOSPITAL_COMMUNITY): Payer: Self-pay

## 2019-09-02 DIAGNOSIS — B9689 Other specified bacterial agents as the cause of diseases classified elsewhere: Secondary | ICD-10-CM

## 2019-09-02 DIAGNOSIS — Z20828 Contact with and (suspected) exposure to other viral communicable diseases: Secondary | ICD-10-CM | POA: Diagnosis not present

## 2019-09-02 DIAGNOSIS — J069 Acute upper respiratory infection, unspecified: Secondary | ICD-10-CM | POA: Insufficient documentation

## 2019-09-02 DIAGNOSIS — Z79899 Other long term (current) drug therapy: Secondary | ICD-10-CM | POA: Diagnosis not present

## 2019-09-02 MED ORDER — METRONIDAZOLE 500 MG PO TABS: 500.0000 mg | ORAL_TABLET | Freq: Two times a day (BID) | ORAL | 0 refills | Status: DC

## 2019-09-02 NOTE — ED Triage Notes (Signed)
Pt was recently expose to covid 19 on Saturday. Pt started having symptoms today.

## 2019-09-02 NOTE — Discharge Instructions (Addendum)
If your Covid-19 test is positive, you will receive a phone call from [facility] regarding your results. Negative test results are not called. Both positive and negative results area always visible on MyChart. If you do not have a MyChart account, sign up instructions are in your discharge papers.   Persons who are directed to care for themselves at home may discontinue isolation under the following conditions:   At least 10 days have passed since symptom onset and  At least 24 hours have passed without running a fever (this means without the use of fever-reducing medications) and  Other symptoms have improved.  Persons infected with COVID-19 who never develop symptoms may discontinue isolation and other precautions 10 days after the date of their first positive COVID-19 test.   Take tylenol 325mg  tablet x 2 up to every 6 hours for sore throat, fever and body aches. Do not exceed 8 tablets in 24 hours.   Stay well hydrated and rest.   Go to the Emergency Department if you experience shortness of breath, severe diarrhea, have severe chest pain.

## 2019-09-02 NOTE — ED Provider Notes (Signed)
[facility]    CSN: [id number] Arrival date & time: 09/02/19  1139      History   Chief Complaint Chief Complaint  Patient presents with  . Exposure To covid 19  . Sore Throat  . Migraine    HPI Joyce Hernandez is a 23 y.o. female.   Patient reports to urgent care today for 1 day of headache and sore throat. Patient reports a covid positive exposure on Saturday. He denies fever, chills, cough, shortness of breath, nausea, vomitting, diarrhea, loss of smell or taste. She denies nasal congestion or feeling like there is drainage in her throat. Sore throat is not made better or worse with food. She has not taken anything for these symptoms.       History reviewed. No pertinent past medical history.  Patient Active Problem List   Diagnosis Date Noted  . Low grade squamous intraepithelial lesion (LGSIL) on cervical Pap smear 08/04/2019    Past Surgical History:  Procedure Laterality Date  . BREAST SURGERY     lymph node removal    OB History    Gravida  0   Para  0   Term  0   Preterm  0   AB  0   Living  0     SAB  0   TAB  0   Ectopic  0   Multiple  0   Live Births  0            Home Medications    Prior to Admission medications   Medication Sig Start Date End Date Taking? Authorizing Provider  acetaminophen (TYLENOL) 500 MG tablet Take 500 mg by mouth 2 (two) times daily as needed for moderate pain or headache.    [provider]  Cetirizine HCl 10 MG CAPS Take 1 capsule (10 mg total) by mouth daily for 10 days. 05/07/19 05/17/19  Wieters, Hallie C, PA-C  fluticasone (FLONASE) 50 MCG/ACT nasal spray Place 1-2 sprays into both nostrils daily for 7 days. 05/07/19 05/14/19  Wieters, Hallie C, PA-C  Levonorgestrel-Ethinyl Estradiol (AMETHIA) 0.15-0.03 &0.01 MG tablet Take 1 tablet by mouth daily. Patient not taking: Reported on 08/29/2019 07/25/19   Gavin Pound, CNM  metroNIDAZOLE (FLAGYL) 500 MG tablet Take 1 tablet (500 mg  total) by mouth 2 (two) times daily. Patient not taking: Reported on 08/29/2019 07/31/19   Vanessa Kick, MD  omeprazole (PRILOSEC) 20 MG capsule Take 1 capsule (20 mg total) by mouth daily. Patient not taking: Reported on 07/25/2019 03/30/18 07/31/19  Raylene Everts, MD    Family History Family History  Problem Relation Age of Onset  . Healthy Mother   . Healthy Father     Social History Social History   Tobacco Use  . Smoking status: Never Smoker  . Smokeless tobacco: Never Used  Substance Use Topics  . Alcohol use: Yes    Comment: socially   . Drug use: No     Allergies   Patient has no known allergies.   Review of Systems Review of Systems  Constitutional: Negative for appetite change, chills, fatigue and fever.  HENT: Negative for ear pain, postnasal drip, rhinorrhea, sinus pressure, sinus pain and sore throat.   Eyes: Negative for itching and visual disturbance.  Respiratory: Negative for cough, chest tightness, shortness of breath and wheezing.   Cardiovascular: Negative for chest pain and palpitations.  Gastrointestinal: Negative for abdominal pain, constipation, diarrhea, nausea and vomiting.  Musculoskeletal: Negative for arthralgias and myalgias.  Neurological: Positive for headaches.  All other systems reviewed and are negative.    Physical Exam Triage Vital Signs ED Triage Vitals  Enc Vitals Group     BP 09/02/19 1200 115/75     Pulse Rate 09/02/19 1200 63     Resp 09/02/19 1200 18     Temp 09/02/19 1200 98.5 F (36.9 C)     Temp Source 09/02/19 1200 Oral     SpO2 09/02/19 1200 99 %     Weight --      Height --      Head Circumference --      Peak Flow --      Pain Score 09/02/19 1202 3     Pain Loc --      Pain Edu? --      Excl. in [place]? --    No data found.  Updated Vital Signs BP 115/75 (BP Location: Right Arm)   Pulse 63   Temp 98.5 F (36.9 C) (Oral)   Resp 18   LMP  (LMP Unknown)   SpO2 99%   Visual Acuity Right Eye  Distance:   Left Eye Distance:   Bilateral Distance:    Right Eye Near:   Left Eye Near:    Bilateral Near:     Physical Exam Vitals and nursing note reviewed.  Constitutional:      General: She is not in acute distress.    Appearance: She is well-developed and normal weight. She is not ill-appearing.  HENT:     Head: Normocephalic and atraumatic.     Nose: No congestion or rhinorrhea.     Mouth/Throat:     Mouth: Mucous membranes are moist. No oral lesions.     Pharynx: Oropharynx is clear. No oropharyngeal exudate, posterior oropharyngeal erythema or uvula swelling.  Eyes:     Conjunctiva/sclera: Conjunctivae normal.  Cardiovascular:     Rate and Rhythm: Normal rate and regular rhythm.     Heart sounds: Normal heart sounds. No murmur. No friction rub. No gallop.   Pulmonary:     Effort: Pulmonary effort is normal. No respiratory distress.     Breath sounds: Normal breath sounds. No stridor. No wheezing or rales.  Musculoskeletal:     Cervical back: Normal range of motion and neck supple.  Lymphadenopathy:     Cervical: No cervical adenopathy.  Skin:    General: Skin is warm and dry.     Capillary Refill: Capillary refill takes less than 2 seconds.  Neurological:     General: No focal deficit present.     Mental Status: She is alert and oriented to person, place, and time.  Psychiatric:        Mood and Affect: Mood normal.        Behavior: Behavior normal.      UC Treatments / Results  Labs (all labs ordered are listed, but only abnormal results are displayed) Labs Reviewed  NOVEL CORONAVIRUS, NAA (HOSP ORDER, SEND-OUT TO REF LAB; TAT 18-24 HRS)    EKG   Radiology No results found.  Procedures Procedures (including critical care time)  Medications Ordered in UC Medications - No data to display  Initial Impression / Assessment and Plan / UC Course  I have reviewed the triage vital signs and the nursing notes.  Pertinent labs & imaging results that  were available during my care of the patient were reviewed by me and considered in my medical decision making (see chart for details).  Viral Upper Respiratory - Early viral syndrome symptoms and known Covid Exposure. PCR Send out sent due to criteria for Rapid not being meet. Recommended symptomatic treatment with tylenol and rest. Covid precautions discussed and ED reporting instructions given. Final Clinical Impressions(s) / UC Diagnoses   Final diagnoses:  Viral upper respiratory illness     Discharge Instructions     If your Covid-19 test is positive, you will receive a phone call from [facility] regarding your results. Negative test results are not called. Both positive and negative results area always visible on MyChart. If you do not have a MyChart account, sign up instructions are in your discharge papers.   Persons who are directed to care for themselves at home may discontinue isolation under the following conditions:  . At least 10 days have passed since symptom onset and . At least 24 hours have passed without running a fever (this means without the use of fever-reducing medications) and . Other symptoms have improved.  Persons infected with COVID-19 who never develop symptoms may discontinue isolation and other precautions 10 days after the date of their first positive COVID-19 test.   Take tylenol 325mg  tablet x 2 up to every 6 hours for sore throat, fever and body aches. Do not exceed 8 tablets in 24 hours.   Stay well hydrated and rest.   Go to the Emergency Department if you experience shortness of breath, severe diarrhea, have severe chest pain.     ED Prescriptions    None     PDMP not reviewed this encounter.   , PA-C 09/02/19 1229

## 2019-09-02 NOTE — Progress Notes (Signed)
Flagyl sent in today for +BV. msg to be sent via mychart.

## 2019-09-04 LAB — NOVEL CORONAVIRUS, NAA (HOSP ORDER, SEND-OUT TO REF LAB; TAT 18-24 HRS): SARS-CoV-2, NAA: NOT DETECTED

## 2019-09-29 ENCOUNTER — Ambulatory Visit (HOSPITAL_COMMUNITY)
Admission: EM | Admit: 2019-09-29 | Discharge: 2019-09-29 | Disposition: A | Discharge status: Home / Self Care | Payer: BLUE CROSS/BLUE SHIELD | Attending: Emergency Medicine | Admitting: Emergency Medicine

## 2019-09-29 ENCOUNTER — Other Ambulatory Visit: Payer: Self-pay

## 2019-09-29 ENCOUNTER — Encounter (HOSPITAL_COMMUNITY): Payer: Self-pay | Admitting: *Deleted

## 2019-09-29 DIAGNOSIS — R05 Cough: Secondary | ICD-10-CM | POA: Diagnosis not present

## 2019-09-29 DIAGNOSIS — U071 COVID-19: Secondary | ICD-10-CM | POA: Diagnosis not present

## 2019-09-29 LAB — POC SARS CORONAVIRUS 2 AG: SARS Coronavirus 2 Ag: POSITIVE — AB

## 2019-09-29 LAB — POC SARS CORONAVIRUS 2 AG -  ED: SARS Coronavirus 2 Ag: POSITIVE — AB

## 2019-09-29 MED ORDER — BENZONATATE 200 MG PO CAPS: 200.0000 mg | ORAL_CAPSULE | Freq: Three times a day (TID) | ORAL | 0 refills | Status: AC | PRN

## 2019-09-29 MED ORDER — CETIRIZINE HCL 10 MG PO CAPS: 10.0000 mg | ORAL_CAPSULE | Freq: Every day | ORAL | 0 refills | Status: DC

## 2019-09-29 MED ORDER — FLUTICASONE PROPIONATE 50 MCG/ACT NA SUSP: 1.0000 | Freq: Every day | NASAL | 0 refills | Status: DC

## 2019-09-29 MED ORDER — IBUPROFEN 800 MG PO TABS: 800.0000 mg | ORAL_TABLET | Freq: Three times a day (TID) | ORAL | 0 refills | Status: DC

## 2019-09-29 NOTE — ED Triage Notes (Signed)
Pt reports recent air travel.  C/O cough, sinus pressure, HA, loss of taste, body aches, nasal congestion x approx 1 wk with worsening over past few days.  Denies fevers.

## 2019-09-29 NOTE — ED Provider Notes (Signed)
[facility]    CSN: [id number] Arrival date & time: 09/29/19  1320      History   Chief Complaint Chief Complaint  Patient presents with  . Cough  . Loss of Taste    HPI Joyce Hernandez is a 24 y.o. female no significant past medical history presenting today for evaluation of URI symptoms.  Patient states that for the past 5 days she has had cough, congestion, sinus pressure, headaches, body aches, loss of taste.  Patient notes that she did recently travel on an airplane and has had indirect exposure to Covid through close contact.  She denies any known fevers, has had some hot and cold chills as well as body aches.  She has not been using any medicines for her symptoms.  She denies chest pain or shortness of breath.  Denies any GI symptoms.  HPI  History reviewed. No pertinent past medical history.  Patient Active Problem List   Diagnosis Date Noted  . Low grade squamous intraepithelial lesion (LGSIL) on cervical Pap smear 08/04/2019    Past Surgical History:  Procedure Laterality Date  . BREAST SURGERY     lymph node removal  . LYMPH NODE DISSECTION      OB History    Gravida  0   Para  0   Term  0   Preterm  0   AB  0   Living  0     SAB  0   TAB  0   Ectopic  0   Multiple  0   Live Births  0            Home Medications    Prior to Admission medications   Medication Sig Start Date End Date Taking? Authorizing Provider  benzonatate (TESSALON) 200 MG capsule Take 1 capsule (200 mg total) by mouth 3 (three) times daily as needed for up to 7 days for cough. 09/29/19 10/06/19  Pernie Grosso C, PA-C  Cetirizine HCl 10 MG CAPS Take 1 capsule (10 mg total) by mouth daily. 09/29/19   Asaiah Hunnicutt C, PA-C  fluticasone (FLONASE) 50 MCG/ACT nasal spray Place 1-2 sprays into both nostrils daily for 7 days. 09/29/19 10/06/19  Danisha Brassfield C, PA-C  ibuprofen (ADVIL) 800 MG tablet Take 1 tablet (800 mg total) by mouth 3 (three) times daily.  09/29/19   Jcion Buddenhagen C, PA-C  Levonorgestrel-Ethinyl Estradiol (AMETHIA) 0.15-0.03 &0.01 MG tablet Take 1 tablet by mouth daily. Patient not taking: Reported on 08/29/2019 07/25/19 09/29/19  Gerrit Heck, CNM  omeprazole (PRILOSEC) 20 MG capsule Take 1 capsule (20 mg total) by mouth daily. Patient not taking: Reported on 07/25/2019 03/30/18 07/31/19  Eustace Moore, MD    Family History Family History  Problem Relation Age of Onset  . Healthy Mother   . Healthy Father     Social History Social History   Tobacco Use  . Smoking status: Never Smoker  . Smokeless tobacco: Never Used  Substance Use Topics  . Alcohol use: Yes    Comment: occasionally  . Drug use: No     Allergies   Patient has no known allergies.   Review of Systems Review of Systems  Constitutional: Positive for appetite change, chills and fatigue. Negative for activity change and fever.  HENT: Positive for congestion, rhinorrhea and sinus pressure. Negative for ear pain, sore throat and trouble swallowing.   Eyes: Negative for discharge and redness.  Respiratory: Positive for cough. Negative for chest tightness and  shortness of breath.   Cardiovascular: Negative for chest pain.  Gastrointestinal: Negative for abdominal pain, diarrhea, nausea and vomiting.  Musculoskeletal: Negative for myalgias.  Skin: Negative for rash.  Neurological: Negative for dizziness, light-headedness and headaches.     Physical Exam Triage Vital Signs ED Triage Vitals  Enc Vitals Group     BP 09/29/19 1601 125/76     Pulse Rate 09/29/19 1601 (!) 103     Resp 09/29/19 1601 20     Temp 09/29/19 1601 99.7 F (37.6 C)     Temp Source 09/29/19 1601 Oral     SpO2 09/29/19 1601 100 %     Weight --      Height --      Head Circumference --      Peak Flow --      Pain Score 09/29/19 1603 6     Pain Loc --      Pain Edu? --      Excl. in [place]? --    No data found.  Updated Vital Signs BP 125/76   Pulse (!) 103    Temp 99.7 F (37.6 C) (Oral)   Resp 20   LMP 09/20/2019 (Approximate)   SpO2 100%   Visual Acuity Right Eye Distance:   Left Eye Distance:   Bilateral Distance:    Right Eye Near:   Left Eye Near:    Bilateral Near:     Physical Exam Vitals and nursing note reviewed.  Constitutional:      General: She is not in acute distress.    Appearance: She is well-developed.  HENT:     Head: Normocephalic and atraumatic.     Ears:     Comments: Bilateral ears without tenderness to palpation of external auricle, tragus and mastoid, EAC's without erythema or swelling, TM's with good bony landmarks and cone of light. Non erythematous.     Mouth/Throat:     Comments: Oral mucosa pink and moist, no tonsillar enlargement or exudate. Posterior pharynx patent and nonerythematous, no uvula deviation or swelling. Normal phonation.  Eyes:     Conjunctiva/sclera: Conjunctivae normal.  Cardiovascular:     Rate and Rhythm: Regular rhythm. Tachycardia present.     Heart sounds: No murmur.  Pulmonary:     Effort: Pulmonary effort is normal. No respiratory distress.     Breath sounds: Normal breath sounds.     Comments: Breathing comfortably at rest, CTABL, no wheezing, rales or other adventitious sounds auscultated Abdominal:     Palpations: Abdomen is soft.     Tenderness: There is no abdominal tenderness.  Musculoskeletal:     Cervical back: Neck supple.  Skin:    General: Skin is warm and dry.  Neurological:     Mental Status: She is alert.      UC Treatments / Results  Labs (all labs ordered are listed, but only abnormal results are displayed) Labs Reviewed  POC SARS CORONAVIRUS 2 AG -  ED - Abnormal; Notable for the following components:      Result Value   SARS Coronavirus 2 Ag POSITIVE (*)    All other components within normal limits  POC SARS CORONAVIRUS 2 AG - Abnormal; Notable for the following components:   SARS Coronavirus 2 Ag POSITIVE (*)    All other components within  normal limits    EKG   Radiology No results found.  Procedures Procedures (including critical care time)  Medications Ordered in UC Medications - No data to display  Initial Impression / Assessment and Plan / UC Course  I have reviewed the triage vital signs and the nursing notes.  Pertinent labs & imaging results that were available during my care of the patient were reviewed by me and considered in my medical decision making (see chart for details).     Rapid Covid positive.  Discussed quarantine recommendations, symptomatic and supportive care.  Patient stable today.  Rest and push fluids.Discussed strict return precautions. Patient verbalized understanding and is agreeable with plan.  Final Clinical Impressions(s) / UC Diagnoses   Final diagnoses:  COVID-19     Discharge Instructions     Your Covid test was positive-continue quarantining for a full 10 days from when your symptoms started as well as until 2 days of consistent symptom improvement and no fevers for 24 hours without medicine Begin daily cetirizine and Flonase to help with nasal congestion, sinus pressure Tessalon every 8 hours as needed for cough Rest and drink plenty of fluids  Please follow-up if symptoms not improving, worsening, developing difficulty breathing or chest pain   ED Prescriptions    Medication Sig Dispense Auth. Provider   benzonatate (TESSALON) 200 MG capsule Take 1 capsule (200 mg total) by mouth 3 (three) times daily as needed for up to 7 days for cough. 28 capsule Ciji Boston C, PA-C   fluticasone (FLONASE) 50 MCG/ACT nasal spray Place 1-2 sprays into both nostrils daily for 7 days. 1 g Tolbert Matheson C, PA-C   Cetirizine HCl 10 MG CAPS Take 1 capsule (10 mg total) by mouth daily. 15 capsule Marybelle Giraldo C, PA-C   ibuprofen (ADVIL) 800 MG tablet Take 1 tablet (800 mg total) by mouth 3 (three) times daily. 21 tablet Dashton Czerwinski, [place] C, PA-C     PDMP not reviewed this  encounter.   Lew Dawes, PA-C 09/29/19 1702

## 2019-09-29 NOTE — Discharge Instructions (Signed)
Your Covid test was positive-continue quarantining for a full 10 days from when your symptoms started as well as until 2 days of consistent symptom improvement and no fevers for 24 hours without medicine Begin daily cetirizine and Flonase to help with nasal congestion, sinus pressure Tessalon every 8 hours as needed for cough Rest and drink plenty of fluids  Please follow-up if symptoms not improving, worsening, developing difficulty breathing or chest pain

## 2019-10-02 ENCOUNTER — Telehealth: Payer: BLUE CROSS/BLUE SHIELD | Admitting: Nurse Practitioner

## 2019-10-02 DIAGNOSIS — N760 Acute vaginitis: Secondary | ICD-10-CM

## 2019-10-02 DIAGNOSIS — B9689 Other specified bacterial agents as the cause of diseases classified elsewhere: Secondary | ICD-10-CM | POA: Diagnosis not present

## 2019-10-02 MED ORDER — METRONIDAZOLE 500 MG PO TABS: 500.0000 mg | ORAL_TABLET | Freq: Two times a day (BID) | ORAL | 0 refills | Status: DC

## 2019-10-02 NOTE — Progress Notes (Signed)
We are sorry that you are not feeling well. Here is how we plan to help!  I contacted patient by phone for further clarification on discharge. She says she has this frequently and just had at the beginning of December. It is usually BV. She says since she has covid, she had no sense of smell. So not sure if has odor or not. Discussed with patient need to see her GN for recurrent BV. She understands the importance of that but cannot go for 2 weeks because of positive covid. I will treat her today and she ill see GYN if reoccurs. Based on what you shared with me it looks like you: May have a vaginosis due to bacteria  Vaginosis is an inflammation of the vagina that can result in discharge, itching and pain. The cause is usually a change in the normal balance of vaginal bacteria or an infection. Vaginosis can also result from reduced estrogen levels after menopause.  The most common causes of vaginosis are:   Bacterial vaginosis which results from an overgrowth of one on several organisms that are normally present in your vagina.   Yeast infections which are caused by a naturally occurring fungus called candida.   Vaginal atrophy (atrophic vaginosis) which results from the thinning of the vagina from reduced estrogen levels after menopause.   Trichomoniasis which is caused by a parasite and is commonly transmitted by sexual intercourse.  Factors that increase your risk of developing vaginosis include: Mar[place] Kitchen Medications, such as antibiotics and steroids . Uncontrolled diabetes . Use of hygiene products such as bubble bath, vaginal spray or vaginal deodorant . Douching . Wearing damp or tight-fitting clothing . Using an intrauterine device (IUD) for birth control . Hormonal changes, such as those associated with pregnancy, birth control pills or menopause . Sexual activity . Having a sexually transmitted infection  Your treatment plan is Metronidazole or Flagyl 500mg  twice a day for 7 days.  I  have electronically sent this prescription into the pharmacy that you have chosen.  Be sure to take all of the medication as directed. Stop taking any medication if you develop a rash, tongue swelling or shortness of breath. Mothers who are breast feeding should consider pumping and discarding their breast milk while on these antibiotics. However, there is no consensus that infant exposure at these doses would be harmful.  Remember that medication creams can weaken latex condoms.   HOME CARE:  Good hygiene may prevent some types of vaginosis from recurring and may relieve some symptoms:  . Avoid baths, hot tubs and whirlpool spas. Rinse soap from your outer genital area after a shower, and dry the area well to prevent irritation. Don't use scented or harsh soaps, such as those with deodorant or antibacterial action. [place] Kitchen Avoid irritants. These include scented tampons and pads. . Wipe from front to back after using the toilet. Doing so avoids spreading fecal bacteria to your vagina.  Other things that may help prevent vaginosis include:  Marland Kitchen Don't douche. Your vagina doesn't require cleansing other than normal bathing. Repetitive douching disrupts the normal organisms that reside in the vagina and can actually increase your risk of vaginal infection. Douching won't clear up a vaginal infection. . Use a latex condom. Both female and female latex condoms may help you avoid infections spread by sexual contact. . Wear cotton underwear. Also wear pantyhose with a cotton crotch. If you feel comfortable without it, skip wearing underwear to bed. Yeast thrives in Mar[place] Kitchen  symptoms should improve in the next day or two.  GET HELP RIGHT AWAY IF:  . You have pain in your lower abdomen ( pelvic area or over your ovaries) . You develop nausea or vomiting . You develop a fever . Your discharge changes or worsens . You have persistent pain with intercourse . You develop shortness of breath, a  rapid pulse, or you faint.  These symptoms could be signs of problems or infections that need to be evaluated by a medical provider now.  MAKE SURE YOU    Understand these instructions.  Will watch your condition.  Will get help right away if you are not doing well or get worse.  Your e-visit answers were reviewed by a board certified advanced clinical practitioner to complete your personal care plan. Depending upon the condition, your plan could have included both over the counter or prescription medications. Please review your pharmacy choice to make sure that you have choses a pharmacy that is open for you to pick up any needed prescription, Your safety is important to [place]. If you have drug allergies check your prescription carefully.   You can use MyChart to ask questions about today's visit, request a non-urgent call back, or ask for a work or school excuse for 24 hours related to this e-Visit. If it has been greater than 24 hours you will need to follow up with your provider, or enter a new e-Visit to address those concerns. You will get a MyChart message within the next two days asking about your experience. I hope that your e-visit has been valuable and will speed your recovery.  5-10 minutes spent reviewing and documenting in chart.

## 2020-01-04 ENCOUNTER — Telehealth: Payer: BLUE CROSS/BLUE SHIELD | Admitting: Family

## 2020-01-04 ENCOUNTER — Ambulatory Visit (HOSPITAL_COMMUNITY)
Admission: EM | Admit: 2020-01-04 | Discharge: 2020-01-04 | Discharge status: Against Medical Advice | Payer: Medicaid Other

## 2020-01-04 ENCOUNTER — Other Ambulatory Visit: Payer: Self-pay

## 2020-01-04 DIAGNOSIS — R300 Dysuria: Secondary | ICD-10-CM

## 2020-01-04 MED ORDER — NITROFURANTOIN MONOHYD MACRO 100 MG PO CAPS: 100.0000 mg | ORAL_CAPSULE | Freq: Two times a day (BID) | ORAL | 0 refills | Status: DC

## 2020-01-04 NOTE — ED Notes (Signed)
Pt medication was already called into the pharmacy

## 2020-01-04 NOTE — Progress Notes (Signed)

## 2020-01-27 ENCOUNTER — Other Ambulatory Visit: Payer: Self-pay

## 2020-01-27 ENCOUNTER — Encounter (HOSPITAL_COMMUNITY): Payer: Self-pay

## 2020-01-27 ENCOUNTER — Ambulatory Visit (HOSPITAL_COMMUNITY)
Admission: EM | Admit: 2020-01-27 | Discharge: 2020-01-27 | Disposition: A | Discharge status: Home / Self Care | Payer: Self-pay | Attending: Emergency Medicine | Admitting: Emergency Medicine

## 2020-01-27 DIAGNOSIS — Z113 Encounter for screening for infections with a predominantly sexual mode of transmission: Secondary | ICD-10-CM

## 2020-01-27 DIAGNOSIS — R300 Dysuria: Secondary | ICD-10-CM

## 2020-01-27 LAB — POCT URINALYSIS DIP (DEVICE)
Bilirubin Urine: NEGATIVE
Glucose, UA: NEGATIVE mg/dL
Hgb urine dipstick: NEGATIVE
Ketones, ur: NEGATIVE mg/dL
Nitrite: POSITIVE — AB
Protein, ur: NEGATIVE mg/dL
Specific Gravity, Urine: >=1.03 (ref 1.005–1.030)
Urobilinogen, UA: 0.2 mg/dL (ref 0.0–1.0)
pH: 6 (ref 5.0–8.0)

## 2020-01-27 LAB — POC URINE PREG, ED: Preg Test, Ur: NEGATIVE

## 2020-01-27 MED ORDER — NITROFURANTOIN MONOHYD MACRO 100 MG PO CAPS: 100.0000 mg | ORAL_CAPSULE | Freq: Two times a day (BID) | ORAL | 0 refills | Status: AC

## 2020-01-27 NOTE — Discharge Instructions (Signed)
Urine showed evidence of infection. We are treating you with macrobid- twice daily for 5 days. Be sure to take full course. Stay hydrated- urine should be pale yellow to clear.   We are testing you for Gonorrhea, Chlamydia, Trichomonas, Yeast and Bacterial Vaginosis. We will call you if anything is positive and let you know if you require any further treatment. Please inform partners of any positive results.   Please return if symptoms not improving with treatment, development of fever, nausea, vomiting, abdominal pain.  Please return or follow up with your primary provider if symptoms not improving with treatment. Please return sooner if you have worsening of symptoms or develop fever, nausea, vomiting, abdominal pain, back pain, lightheadedness, dizziness.

## 2020-01-27 NOTE — ED Triage Notes (Signed)
Pt is here with vaginal burning for 2 days now. Pt has last unprotected sex a week ago, wants STD testing. Pt has used a suppository to relieve discomfort, pt states she has a hx of BV.

## 2020-01-28 ENCOUNTER — Telehealth (HOSPITAL_COMMUNITY): Payer: Self-pay

## 2020-01-28 LAB — CERVICOVAGINAL ANCILLARY ONLY
Bacterial Vaginitis (gardnerella): POSITIVE — AB
Candida Glabrata: NEGATIVE
Candida Vaginitis: NEGATIVE
Chlamydia: NEGATIVE
Comment: NEGATIVE
Comment: NEGATIVE
Comment: NEGATIVE
Comment: NEGATIVE
Comment: NEGATIVE
Comment: NORMAL
Neisseria Gonorrhea: NEGATIVE
Trichomonas: NEGATIVE

## 2020-01-28 MED ORDER — METRONIDAZOLE 500 MG PO TABS
500.0000 mg | ORAL_TABLET | Freq: Two times a day (BID) | ORAL | 0 refills | Status: DC
Start: 2020-01-28 — End: 2020-04-10

## 2020-01-28 NOTE — ED Provider Notes (Signed)
[facility]    CSN: [id number] Arrival date & time: 01/27/20  1531      History   Chief Complaint Chief Complaint  Patient presents with  . S74.5  . Urinary Tract Infection    HPI Joyce Hernandez is a 24 y.o. female no significant past medical history presenting today for evaluation of dysuria.  Patient notes that over the past few days she has developed dysuria and vaginal burning with urination.  Patient is also reported urinary urgency, denies frequency or hematuria.  She denies any discharge or odor.  She reports that she had unprotected intercourse approximately 1 week ago and would like to have STD testing.  She reports history of prior UTIs and BV.  Symptoms today feel more similar to prior UTIs.  She denies fevers, nausea or vomiting.  She has had some intermittent mild lower abdominal discomfort.  Denies back pain.  Denies any rashes or lesions.  HPI  History reviewed. No pertinent past medical history.  Patient Active Problem List   Diagnosis Date Noted  . Low grade squamous intraepithelial lesion (LGSIL) on cervical Pap smear 08/04/2019    Past Surgical History:  Procedure Laterality Date  . BREAST SURGERY     lymph node removal  . LYMPH NODE DISSECTION      OB History    Gravida  0   Para  0   Term  0   Preterm  0   AB  0   Living  0     SAB  0   TAB  0   Ectopic  0   Multiple  0   Live Births  0            Home Medications    Prior to Admission medications   Medication Sig Start Date End Date Taking? Authorizing Provider  ibuprofen (ADVIL) 800 MG tablet Take 1 tablet (800 mg total) by mouth 3 (three) times daily. 09/29/19   Attie Nawabi C, PA-C  nitrofurantoin, macrocrystal-monohydrate, (MACROBID) 100 MG capsule Take 1 capsule (100 mg total) by mouth 2 (two) times daily for 5 days. 01/27/20 02/01/20  Jeralyn Nolden C, PA-C  Cetirizine HCl 10 MG CAPS Take 1 capsule (10 mg total) by mouth daily. 09/29/19 01/27/20  Savonna Birchmeier,  Loralei Radcliffe C, PA-C  fluticasone (FLONASE) 50 MCG/ACT nasal spray Place 1-2 sprays into both nostrils daily for 7 days. 09/29/19 01/27/20  Asheton Viramontes C, PA-C  Levonorgestrel-Ethinyl Estradiol (AMETHIA) 0.15-0.03 &0.01 MG tablet Take 1 tablet by mouth daily. Patient not taking: Reported on 08/29/2019 07/25/19 09/29/19  Gerrit Heck, CNM  omeprazole (PRILOSEC) 20 MG capsule Take 1 capsule (20 mg total) by mouth daily. Patient not taking: Reported on 07/25/2019 03/30/18 07/31/19  Eustace Moore, MD    Family History Family History  Problem Relation Age of Onset  . Healthy Mother   . Healthy Father     Social History Social History   Tobacco Use  . Smoking status: Never Smoker  . Smokeless tobacco: Never Used  Substance Use Topics  . Alcohol use: Yes    Comment: occasionally  . Drug use: No     Allergies   No known allergies   Review of Systems Review of Systems  Constitutional: Negative for fever.  Respiratory: Negative for shortness of breath.   Cardiovascular: Negative for chest pain.  Gastrointestinal: Negative for abdominal pain, diarrhea, nausea and vomiting.  Genitourinary: Positive for dysuria and urgency. Negative for flank pain, genital sores, hematuria, menstrual problem,  vaginal bleeding, vaginal discharge and vaginal pain.  Musculoskeletal: Negative for back pain.  Skin: Negative for rash.  Neurological: Negative for dizziness, light-headedness and headaches.     Physical Exam Triage Vital Signs ED Triage Vitals  Enc Vitals Group     BP 01/27/20 1603 122/71     Pulse Rate 01/27/20 1603 79     Resp 01/27/20 1603 18     Temp 01/27/20 1603 98.4 F (36.9 C)     Temp Source 01/27/20 1603 Oral     SpO2 01/27/20 1603 100 %     Weight 01/27/20 1601 148 lb (67.1 kg)     Height --      Head Circumference --      Peak Flow --      Pain Score 01/27/20 1601 0     Pain Loc --      Pain Edu? --      Excl. in [place]? --    No data found.  Updated Vital Signs BP  122/71 (BP Location: Right Arm)   Pulse 79   Temp 98.4 F (36.9 C) (Oral)   Resp 18   Wt 148 lb (67.1 kg)   LMP 01/20/2020   SpO2 100%   BMI 28.90 kg/m   Visual Acuity Right Eye Distance:   Left Eye Distance:   Bilateral Distance:    Right Eye Near:   Left Eye Near:    Bilateral Near:     Physical Exam Vitals and nursing note reviewed.  Constitutional:      Appearance: She is well-developed.     Comments: No acute distress  HENT:     Head: Normocephalic and atraumatic.     Nose: Nose normal.  Eyes:     Conjunctiva/sclera: Conjunctivae normal.  Cardiovascular:     Rate and Rhythm: Normal rate.  Pulmonary:     Effort: Pulmonary effort is normal. No respiratory distress.  Abdominal:     General: There is no distension.     Comments: Soft, nondistended, nontender to light and deep palpation throughout abdomen  Genitourinary:    Comments: deferred Musculoskeletal:        General: Normal range of motion.     Cervical back: Neck supple.  Skin:    General: Skin is warm and dry.  Neurological:     Mental Status: She is alert and oriented to person, place, and time.      UC Treatments / Results  Labs (all labs ordered are listed, but only abnormal results are displayed) Labs Reviewed  POCT URINALYSIS DIP (DEVICE) - Abnormal; Notable for the following components:      Result Value   Nitrite POSITIVE (*)    Leukocytes,Ua SMALL (*)    All other components within normal limits  URINE CULTURE  POC URINE PREG, ED  CERVICOVAGINAL ANCILLARY ONLY    EKG   Radiology No results found.  Procedures Procedures (including critical care time)  Medications Ordered in UC Medications - No data to display  Initial Impression / Assessment and Plan / UC Course  I have reviewed the triage vital signs and the nursing notes.  Pertinent labs & imaging results that were available during my care of the patient were reviewed by me and considered in my medical decision making  (see chart for details).     Vaginal swab pending for STD screening, small leuks and positive nitrites on UA, suggestive of UTI at this time, will send for culture to confirm.  Initiated on  Macrobid twice daily x5 days, push fluids.  Will call with results of culture and swab and alter treatment as needed.  Discussed strict return precautions. Patient verbalized understanding and is agreeable with plan.  Final Clinical Impressions(s) / UC Diagnoses   Final diagnoses:  Dysuria  Screen for STD (sexually transmitted disease)     Discharge Instructions     Urine showed evidence of infection. We are treating you with macrobid- twice daily for 5 days. Be sure to take full course. Stay hydrated- urine should be pale yellow to clear.   We are testing you for Gonorrhea, Chlamydia, Trichomonas, Yeast and Bacterial Vaginosis. We will call you if anything is positive and let you know if you require any further treatment. Please inform partners of any positive results.   Please return if symptoms not improving with treatment, development of fever, nausea, vomiting, abdominal pain.  Please return or follow up with your primary provider if symptoms not improving with treatment. Please return sooner if you have worsening of symptoms or develop fever, nausea, vomiting, abdominal pain, back pain, lightheadedness, dizziness.   ED Prescriptions    Medication Sig Dispense Auth. Provider   nitrofurantoin, macrocrystal-monohydrate, (MACROBID) 100 MG capsule Take 1 capsule (100 mg total) by mouth 2 (two) times daily for 5 days. 10 capsule Marshel Golubski, [place] C, PA-C     PDMP not reviewed this encounter.   Lew Dawes, [place] 01/28/20 [id number]

## 2020-01-29 LAB — URINE CULTURE: Culture: >=100000 — AB

## 2020-02-09 ENCOUNTER — Other Ambulatory Visit: Payer: Self-pay

## 2020-02-09 ENCOUNTER — Encounter (HOSPITAL_COMMUNITY): Payer: Self-pay

## 2020-02-09 ENCOUNTER — Ambulatory Visit (HOSPITAL_COMMUNITY)
Admission: EM | Admit: 2020-02-09 | Discharge: 2020-02-09 | Disposition: A | Discharge status: Home / Self Care | Payer: Medicaid Other | Attending: Physician Assistant | Admitting: Physician Assistant

## 2020-02-09 ENCOUNTER — Telehealth: Payer: Medicaid Other

## 2020-02-09 DIAGNOSIS — B373 Candidiasis of vulva and vagina: Secondary | ICD-10-CM | POA: Insufficient documentation

## 2020-02-09 DIAGNOSIS — Z3202 Encounter for pregnancy test, result negative: Secondary | ICD-10-CM

## 2020-02-09 DIAGNOSIS — R0789 Other chest pain: Secondary | ICD-10-CM | POA: Insufficient documentation

## 2020-02-09 DIAGNOSIS — B3731 Acute candidiasis of vulva and vagina: Secondary | ICD-10-CM

## 2020-02-09 LAB — POC URINE PREG, ED: Preg Test, Ur: NEGATIVE

## 2020-02-09 MED ORDER — SIMETHICONE 80 MG PO CHEW
80.0000 mg | CHEWABLE_TABLET | Freq: Four times a day (QID) | ORAL | 0 refills | Status: DC | PRN
Start: 2020-02-09 — End: 2020-04-10

## 2020-02-09 MED ORDER — FLUCONAZOLE 150 MG PO TABS
150.0000 mg | ORAL_TABLET | Freq: Every day | ORAL | 0 refills | Status: DC
Start: 2020-02-09 — End: 2020-04-10

## 2020-02-09 MED ORDER — DICLOFENAC SODIUM 1 % EX GEL: 4.0000 g | Freq: Four times a day (QID) | CUTANEOUS | 0 refills | Status: DC

## 2020-02-09 MED ORDER — FAMOTIDINE 20 MG PO TABS
20.0000 mg | ORAL_TABLET | Freq: Two times a day (BID) | ORAL | 0 refills | Status: DC
Start: 2020-02-09 — End: 2020-04-10

## 2020-02-09 NOTE — Discharge Instructions (Addendum)
I do not believe this is your heart or your lungs.  This may be related to gas or muscles in your chest wall.  I want you to try taking some Pepcid and Gas-X.  Also like for you to try topical diclofenac on the area that is bothering you and wear looser fitting bras.  Take the diflucan today. We have sent a swab to confirm and test for other causes of your symptoms, we will notify you of cahnges to your treatment   If pain becomes severe, you have shortness of breath, nausea and or vomiting with it please report to the emergency department.  Establish care with the family medicine clinic.

## 2020-02-09 NOTE — ED Triage Notes (Signed)
Pt c/o 5/10 dull aching pain in left upper rib cage area. Pt denies injury. Pt states she gets the pain when she exhales. Pt denies cough, congestion. Pt denies chest pain. Pt c/o SOB.

## 2020-02-09 NOTE — ED Provider Notes (Signed)
[facility]    CSN: [id number] Arrival date & time: 02/09/20  1252      History   Chief Complaint Chief Complaint  Patient presents with  . rib cage pain    HPI Joyce Hernandez is a 24 y.o. female.   Patient is a otherwise healthy young lady presenting for left lower chest/rib pain.  She reports pain is in the front of her chest/ribs just below her left breast.  She reports is been going on for few days.  She reports a dull aching 5 out of 10 intermittently over the last few days.  Up to 3 episodes a day.  She reports today she was at work and pain was an 8/10 and she felt like she may be feeling shortness of breath with it.  Since then pain is resolved and she is no longer having shortness of breath in clinic.  She does report that if she takes a deep breath then at the end of inspiration just before expiration she feels the tightness in her chest at the location of pain.  Denies shortness of breath at rest.  Denies chest pain currently.  Denies cough.  Denies nausea, vomiting or diaphoresis with it.  Denies irregular heart rate when this occurs.  No recent lower extremity swelling or calf pain.  Not on oral contraceptives.  She is a non-smoker.  No recent surgeries.  She is actively working as a Lawyer.   She is also reporting some active discharge and irritation.  She reports this started after completing nitrofurantoin from UTI she had earlier in May.  Stating all the nitrofurantoin.  Had resolution of symptoms following.  Reports it is consistently for her to have a yeast infection following a antibiotic course.  She reports her last menstrual cycle was early May and is currently sexually active.  She denies vaginal pain or lower abdominal pain.  Denies fever or chills.     History reviewed. No pertinent past medical history.  Patient Active Problem List   Diagnosis Date Noted  . Low grade squamous intraepithelial lesion (LGSIL) on cervical Pap smear 08/04/2019    Past  Surgical History:  Procedure Laterality Date  . BREAST SURGERY     lymph node removal  . LYMPH NODE DISSECTION      OB History    Gravida  0   Para  0   Term  0   Preterm  0   AB  0   Living  0     SAB  0   TAB  0   Ectopic  0   Multiple  0   Live Births  0            Home Medications    Prior to Admission medications   Medication Sig Start Date End Date Taking? Authorizing Provider  diclofenac Sodium (VOLTAREN) 1 % GEL Apply 4 g topically 4 (four) times daily. 02/09/20   Marcine Gadway, Veryl Speak, PA-C  famotidine (PEPCID) 20 MG tablet Take 1 tablet (20 mg total) by mouth 2 (two) times daily. 02/09/20   Makia Bossi, Veryl Speak, PA-C  fluconazole (DIFLUCAN) 150 MG tablet Take 1 tablet (150 mg total) by mouth daily. 02/09/20   Marleny Faller, Veryl Speak, PA-C  ibuprofen (ADVIL) 800 MG tablet Take 1 tablet (800 mg total) by mouth 3 (three) times daily. 09/29/19   Wieters, Hallie C, PA-C  metroNIDAZOLE (FLAGYL) 500 MG tablet Take 1 tablet (500 mg total) by mouth 2 (two) times daily.  01/28/20   Lamptey, Britta Mccreedy, MD  simethicone (GAS-X) 80 MG chewable tablet Chew 1 tablet (80 mg total) by mouth every 6 (six) hours as needed for flatulence. 02/09/20   Earnestene Angello, Veryl Speak, PA-C  Cetirizine HCl 10 MG CAPS Take 1 capsule (10 mg total) by mouth daily. 09/29/19 01/27/20  Wieters, Hallie C, PA-C  fluticasone (FLONASE) 50 MCG/ACT nasal spray Place 1-2 sprays into both nostrils daily for 7 days. 09/29/19 01/27/20  Wieters, Hallie C, PA-C  Levonorgestrel-Ethinyl Estradiol (AMETHIA) 0.15-0.03 &0.01 MG tablet Take 1 tablet by mouth daily. Patient not taking: Reported on 08/29/2019 07/25/19 09/29/19  Gerrit Heck, CNM  omeprazole (PRILOSEC) 20 MG capsule Take 1 capsule (20 mg total) by mouth daily. Patient not taking: Reported on 07/25/2019 03/30/18 07/31/19  Eustace Moore, MD    Family History Family History  Problem Relation Age of Onset  . Healthy Mother   . Healthy Father     Social History Social History    Tobacco Use  . Smoking status: Never Smoker  . Smokeless tobacco: Never Used  Substance Use Topics  . Alcohol use: Yes    Comment: occasionally  . Drug use: No     Allergies   No known allergies   Review of Systems Review of Systems   Physical Exam Triage Vital Signs ED Triage Vitals  Enc Vitals Group     BP 02/09/20 1338 127/74     Pulse Rate 02/09/20 1338 75     Resp 02/09/20 1338 18     Temp 02/09/20 1338 98.4 F (36.9 C)     Temp Source 02/09/20 1338 Oral     SpO2 02/09/20 1338 100 %     Weight 02/09/20 1341 147 lb (66.7 kg)     Height 02/09/20 1341 5' (1.524 m)     Head Circumference --      Peak Flow --      Pain Score 02/09/20 1340 5     Pain Loc --      Pain Edu? --      Excl. in [place]? --    No data found.  Updated Vital Signs BP 127/74   Pulse 75   Temp 98.4 F (36.9 C) (Oral)   Resp 18   Ht 5' (1.524 m)   Wt 147 lb (66.7 kg)   LMP 01/20/2020   SpO2 100%   BMI 28.71 kg/m   Visual Acuity Right Eye Distance:   Left Eye Distance:   Bilateral Distance:    Right Eye Near:   Left Eye Near:    Bilateral Near:     Physical Exam Vitals and nursing note reviewed.  Constitutional:      General: She is not in acute distress.    Appearance: Normal appearance. She is well-developed. She is not ill-appearing.  HENT:     Head: Normocephalic and atraumatic.     Mouth/Throat:     Mouth: Mucous membranes are moist.     Pharynx: Oropharynx is clear.  Eyes:     Conjunctiva/sclera: Conjunctivae normal.  Cardiovascular:     Rate and Rhythm: Normal rate and regular rhythm.     Heart sounds: No murmur.  Pulmonary:     Effort: Pulmonary effort is normal. No respiratory distress.     Breath sounds: Normal breath sounds. No wheezing.  Chest:     Chest wall: Tenderness (Very mild tenderness over left lower ribs mid axillary line.) present.  Abdominal:     Palpations: Abdomen is  soft.     Tenderness: There is no abdominal tenderness. There is no right  CVA tenderness or left CVA tenderness.  Musculoskeletal:     Cervical back: Neck supple.     Right lower leg: No edema.     Left lower leg: No edema.  Skin:    General: Skin is warm and dry.  Neurological:     Mental Status: She is alert.      UC Treatments / Results  Labs (all labs ordered are listed, but only abnormal results are displayed) Labs Reviewed  POC URINE PREG, ED  CERVICOVAGINAL ANCILLARY ONLY    EKG   Radiology No results found.  Procedures Procedures (including critical care time)  Medications Ordered in UC Medications - No data to display  Initial Impression / Assessment and Plan / UC Course  I have reviewed the triage vital signs and the nursing notes.  Pertinent labs & imaging results that were available during my care of the patient were reviewed by me and considered in my medical decision making (see chart for details).     #Chest wall pain #Yeast vaginitis Patient is 24 year old presenting with symptoms most consistent with chest wall pain.  She has normal vital signs with a normal heart rate.  Cardiac history.  Pain is reproducible.  Doubt cardiac or PE at this time.  Consider musculoskeletal or gas trapping.  We will treat accordingly.  Patient is also experiencing vaginitis symptoms.  We will treat for yeast and send swab.  Patient is reassured verbalized understanding plan. Final Clinical Impressions(s) / UC Diagnoses   Final diagnoses:  Chest wall pain  Yeast vaginitis     Discharge Instructions     I do not believe this is your heart or your lungs.  This may be related to gas or muscles in your chest wall.  I want you to try taking some Pepcid and Gas-X.  Also like for you to try topical diclofenac on the area that is bothering you and wear looser fitting bras.  Take the diflucan today. We have sent a swab to confirm and test for other causes of your symptoms, we will notify you of cahnges to your treatment   If pain becomes  severe, you have shortness of breath, nausea and or vomiting with it please report to the emergency department.  Establish care with the family medicine clinic.     ED Prescriptions    Medication Sig Dispense Auth. Provider   famotidine (PEPCID) 20 MG tablet Take 1 tablet (20 mg total) by mouth 2 (two) times daily. 30 tablet Vermon Grays, Marguerita Beards, PA-C   simethicone (GAS-X) 80 MG chewable tablet Chew 1 tablet (80 mg total) by mouth every 6 (six) hours as needed for flatulence. 30 tablet Tynika Luddy, Marguerita Beards, PA-C   diclofenac Sodium (VOLTAREN) 1 % GEL Apply 4 g topically 4 (four) times daily. 100 g Faust Thorington, Marguerita Beards, PA-C   fluconazole (DIFLUCAN) 150 MG tablet Take 1 tablet (150 mg total) by mouth daily. 1 tablet Laverta Harnisch, Marguerita Beards, PA-C     PDMP not reviewed this encounter.   Purnell Shoemaker, PA-C 02/09/20 1442

## 2020-02-10 LAB — CERVICOVAGINAL ANCILLARY ONLY
Bacterial Vaginitis (gardnerella): NEGATIVE
Candida Glabrata: NEGATIVE
Candida Vaginitis: POSITIVE — AB
Chlamydia: NEGATIVE
Comment: NEGATIVE
Comment: NEGATIVE
Comment: NEGATIVE
Comment: NEGATIVE
Comment: NEGATIVE
Comment: NORMAL
Neisseria Gonorrhea: NEGATIVE
Trichomonas: NEGATIVE

## 2020-04-10 ENCOUNTER — Ambulatory Visit (HOSPITAL_COMMUNITY)
Admission: EM | Admit: 2020-04-10 | Discharge: 2020-04-10 | Disposition: A | Discharge status: Home / Self Care | Payer: Medicaid Other | Attending: Family Medicine | Admitting: Family Medicine

## 2020-04-10 ENCOUNTER — Other Ambulatory Visit: Payer: Self-pay

## 2020-04-10 ENCOUNTER — Encounter (HOSPITAL_COMMUNITY): Payer: Self-pay

## 2020-04-10 DIAGNOSIS — Z202 Contact with and (suspected) exposure to infections with a predominantly sexual mode of transmission: Secondary | ICD-10-CM | POA: Insufficient documentation

## 2020-04-10 DIAGNOSIS — N390 Urinary tract infection, site not specified: Secondary | ICD-10-CM | POA: Insufficient documentation

## 2020-04-10 LAB — POCT URINALYSIS DIP (DEVICE)
Glucose, UA: 100 mg/dL — AB
Hgb urine dipstick: NEGATIVE
Ketones, ur: 15 mg/dL — AB
Nitrite: POSITIVE — AB
Protein, ur: 100 mg/dL — AB
Specific Gravity, Urine: 1.02 (ref 1.005–1.030)
Urobilinogen, UA: 4 mg/dL — ABNORMAL HIGH (ref 0.0–1.0)
pH: 5 (ref 5.0–8.0)

## 2020-04-10 LAB — POC URINE PREG, ED: Preg Test, Ur: NEGATIVE

## 2020-04-10 MED ORDER — NITROFURANTOIN MONOHYD MACRO 100 MG PO CAPS
100.0000 mg | ORAL_CAPSULE | Freq: Two times a day (BID) | ORAL | 0 refills | Status: DC
Start: 2020-04-10 — End: 2020-04-27

## 2020-04-10 NOTE — ED Triage Notes (Signed)
Pt c/o low mid abdominal pain and dysuria x 2 days. Took AZO with minor relief. Also requesting STD testing.

## 2020-04-10 NOTE — Discharge Instructions (Addendum)
Take your antibiotic 2 times a day Drink plenty of water Check for your test results on MyChart

## 2020-04-10 NOTE — ED Provider Notes (Signed)
[facility]    CSN: [id number] Arrival date & time: 04/10/20  1545      History   Chief Complaint Chief Complaint  Patient presents with  . Dysuria    HPI Joyce Hernandez is a 24 y.o. female.   HPI  Patient states that she has a bladder infection.  Dysuria, frequency, and some suprapubic pressure.  No abdominal pain.  No nausea or vomiting.  No fever or chills.  No flank pain.  No history of kidney stones or kidney infection Patient has had unprotected sexual relations.  She desires STD testing.  She refuses blood work.  History reviewed. No pertinent past medical history.  Patient Active Problem List   Diagnosis Date Noted  . Low grade squamous intraepithelial lesion (LGSIL) on cervical Pap smear 08/04/2019    Past Surgical History:  Procedure Laterality Date  . BREAST SURGERY     lymph node removal  . LYMPH NODE DISSECTION      OB History    Gravida  0   Para  0   Term  0   Preterm  0   AB  0   Living  0     SAB  0   TAB  0   Ectopic  0   Multiple  0   Live Births  0            Home Medications    Prior to Admission medications   Medication Sig Start Date End Date Taking? Authorizing Provider  ibuprofen (ADVIL) 800 MG tablet Take 1 tablet (800 mg total) by mouth 3 (three) times daily. 09/29/19   Wieters, Hallie C, PA-C  nitrofurantoin, macrocrystal-monohydrate, (MACROBID) 100 MG capsule Take 1 capsule (100 mg total) by mouth 2 (two) times daily. 04/10/20   Eustace Moore, MD  Cetirizine HCl 10 MG CAPS Take 1 capsule (10 mg total) by mouth daily. 09/29/19 01/27/20  Wieters, Hallie C, PA-C  fluticasone (FLONASE) 50 MCG/ACT nasal spray Place 1-2 sprays into both nostrils daily for 7 days. 09/29/19 01/27/20  Wieters, Hallie C, PA-C  Levonorgestrel-Ethinyl Estradiol (AMETHIA) 0.15-0.03 &0.01 MG tablet Take 1 tablet by mouth daily. Patient not taking: Reported on 08/29/2019 07/25/19 09/29/19  Gerrit Heck, CNM  omeprazole (PRILOSEC) 20  MG capsule Take 1 capsule (20 mg total) by mouth daily. Patient not taking: Reported on 07/25/2019 03/30/18 07/31/19  Eustace Moore, MD    Family History Family History  Problem Relation Age of Onset  . Healthy Mother   . Healthy Father     Social History Social History   Tobacco Use  . Smoking status: Never Smoker  . Smokeless tobacco: Never Used  Vaping Use  . Vaping Use: Never used  Substance Use Topics  . Alcohol use: Yes    Comment: occasionally  . Drug use: No     Allergies   No known allergies   Review of Systems Review of Systems See HPI  Physical Exam Triage Vital Signs ED Triage Vitals  Enc Vitals Group     BP 04/10/20 1641 (!) 134/83     Pulse Rate 04/10/20 1641 76     Resp 04/10/20 1641 18     Temp 04/10/20 1641 98 F (36.7 C)     Temp src --      SpO2 04/10/20 1641 100 %     Weight --      Height --      Head Circumference --  Peak Flow --      Pain Score 04/10/20 1642 4     Pain Loc --      Pain Edu? --      Excl. in [place]? --    No data found.  Updated Vital Signs BP (!) 134/83   Pulse 76   Temp 98 F (36.7 C)   Resp 18   LMP 03/30/2020   SpO2 100%      Physical Exam   UC Treatments / Results  Labs (all labs ordered are listed, but only abnormal results are displayed) Labs Reviewed  POCT URINALYSIS DIP (DEVICE) - Abnormal; Notable for the following components:      Result Value   Glucose, UA 100 (*)    Bilirubin Urine SMALL (*)    Ketones, ur 15 (*)    Protein, ur 100 (*)    Urobilinogen, UA 4.0 (*)    Nitrite POSITIVE (*)    Leukocytes,Ua LARGE (*)    All other components within normal limits  URINE CULTURE  POC URINE PREG, ED  CERVICOVAGINAL ANCILLARY ONLY    EKG   Radiology No results found.  Procedures Procedures (including critical care time)  Medications Ordered in UC Medications - No data to display  Initial Impression / Assessment and Plan / UC Course  I have reviewed the triage vital signs  and the nursing notes.  Pertinent labs & imaging results that were available during my care of the patient were reviewed by me and considered in my medical decision making (see chart for details).      Final Clinical Impressions(s) / UC Diagnoses   Final diagnoses:  Lower urinary tract infectious disease  Possible exposure to STD     Discharge Instructions     Take your antibiotic 2 times a day Drink plenty of water Check for your test results on MyChart     ED Prescriptions    Medication Sig Dispense Auth. Provider   nitrofurantoin, macrocrystal-monohydrate, (MACROBID) 100 MG capsule Take 1 capsule (100 mg total) by mouth 2 (two) times daily. 10 capsule Eustace Moore, MD     PDMP not reviewed this encounter.   Eustace Moore, MD 04/10/20 Windy Fast

## 2020-04-12 LAB — URINE CULTURE: Culture: 80000 — AB

## 2020-04-13 LAB — CERVICOVAGINAL ANCILLARY ONLY
Chlamydia: NEGATIVE
Comment: NEGATIVE
Comment: NEGATIVE
Comment: NORMAL
Neisseria Gonorrhea: NEGATIVE
Trichomonas: NEGATIVE

## 2020-04-27 ENCOUNTER — Ambulatory Visit (HOSPITAL_COMMUNITY)
Admission: EM | Admit: 2020-04-27 | Discharge: 2020-04-27 | Disposition: A | Discharge status: Home / Self Care | Payer: No Typology Code available for payment source | Attending: Family Medicine | Admitting: Family Medicine

## 2020-04-27 ENCOUNTER — Other Ambulatory Visit: Payer: Self-pay

## 2020-04-27 ENCOUNTER — Encounter (HOSPITAL_COMMUNITY): Payer: Self-pay

## 2020-04-27 DIAGNOSIS — N3001 Acute cystitis with hematuria: Secondary | ICD-10-CM | POA: Diagnosis present

## 2020-04-27 DIAGNOSIS — Z3202 Encounter for pregnancy test, result negative: Secondary | ICD-10-CM

## 2020-04-27 HISTORY — DX: Urinary tract infection, site not specified: N39.0

## 2020-04-27 HISTORY — DX: Candidiasis, unspecified: B37.9

## 2020-04-27 HISTORY — DX: Other specified bacterial agents as the cause of diseases classified elsewhere: B96.89

## 2020-04-27 LAB — POC URINE PREG, ED: Preg Test, Ur: NEGATIVE

## 2020-04-27 MED ORDER — CIPROFLOXACIN HCL 500 MG PO TABS
500.0000 mg | ORAL_TABLET | Freq: Two times a day (BID) | ORAL | 0 refills | Status: DC
Start: 2020-04-27 — End: 2020-10-23

## 2020-04-27 MED ORDER — CIPROFLOXACIN HCL 500 MG PO TABS
500.0000 mg | ORAL_TABLET | Freq: Two times a day (BID) | ORAL | 0 refills | Status: DC
Start: 2020-04-27 — End: 2020-04-27

## 2020-04-27 NOTE — ED Triage Notes (Signed)
Pt c/o frequent urination, dysuria, urine urgencyx4 days.

## 2020-04-27 NOTE — Discharge Instructions (Signed)
If your symptoms do not improve with this course of antibiotic treatment return for immediate follow-up given the close proximity to the time as you have had your last UTI.  Urine culture is pending therefore if any medication needs to be changed we will reach out to you via MyChart.

## 2020-04-27 NOTE — ED Provider Notes (Signed)
[facility]    CSN: [id number] Arrival date & time: 04/27/20  1223      History   Chief Complaint Chief Complaint  Patient presents with  . Urinary Tract Infection    HPI Joyce Hernandez is a 24 y.o. female.   HPI Patient presents today with 4 days of UTI symptoms.  Of recent patient has had recurrent UTIs with her most recent UTI with a positive culture on 04/10/2020.  She denies fever, CVA tenderness, or abnormal vaginal discharge. No concern for STD. Endorses completing medication, however, symptoms never completely resolved. Past Medical History:  Diagnosis Date  . Bacterial vaginosis   . UTI (urinary tract infection)   . Yeast infection     Patient Active Problem List   Diagnosis Date Noted  . Low grade squamous intraepithelial lesion (LGSIL) on cervical Pap smear 08/04/2019    Past Surgical History:  Procedure Laterality Date  . BREAST SURGERY     lymph node removal  . LYMPH NODE DISSECTION      OB History    Gravida  0   Para  0   Term  0   Preterm  0   AB  0   Living  0     SAB  0   TAB  0   Ectopic  0   Multiple  0   Live Births  0            Home Medications    Prior to Admission medications   Medication Sig Start Date End Date Taking? Authorizing Provider  ibuprofen (ADVIL) 800 MG tablet Take 1 tablet (800 mg total) by mouth 3 (three) times daily. 09/29/19   Wieters, Hallie C, PA-C  nitrofurantoin, macrocrystal-monohydrate, (MACROBID) 100 MG capsule Take 1 capsule (100 mg total) by mouth 2 (two) times daily. 04/10/20   Eustace Moore, MD  Cetirizine HCl 10 MG CAPS Take 1 capsule (10 mg total) by mouth daily. 09/29/19 01/27/20  Wieters, Hallie C, PA-C  fluticasone (FLONASE) 50 MCG/ACT nasal spray Place 1-2 sprays into both nostrils daily for 7 days. 09/29/19 01/27/20  Wieters, Hallie C, PA-C  Levonorgestrel-Ethinyl Estradiol (AMETHIA) 0.15-0.03 &0.01 MG tablet Take 1 tablet by mouth daily. Patient not taking: Reported on  08/29/2019 07/25/19 09/29/19  Gerrit Heck, CNM  omeprazole (PRILOSEC) 20 MG capsule Take 1 capsule (20 mg total) by mouth daily. Patient not taking: Reported on 07/25/2019 03/30/18 07/31/19  Eustace Moore, MD    Family History Family History  Problem Relation Age of Onset  . Healthy Mother   . Healthy Father     Social History Social History   Tobacco Use  . Smoking status: Never Smoker  . Smokeless tobacco: Never Used  Vaping Use  . Vaping Use: Never used  Substance Use Topics  . Alcohol use: Yes    Comment: occasionally  . Drug use: No     Allergies   No known allergies   Review of Systems Review of Systems Pertinent negatives listed in HPI  Physical Exam Triage Vital Signs ED Triage Vitals [04/27/20 1445]  Enc Vitals Group     BP 124/69     Pulse Rate 67     Resp 16     Temp 98.1 F (36.7 C)     Temp Source Oral     SpO2 100 %     Weight 149 lb (67.6 kg)     Height 5\' 4"  (1.626 m)     Head  Circumference      Peak Flow      Pain Score 0     Pain Loc      Pain Edu?      Excl. in GC?    No data found.  Updated Vital Signs BP 124/69   Pulse 67   Temp 98.1 F (36.7 C) (Oral)   Resp 16   Ht 5\' 4"  (1.626 m)   Wt 149 lb (67.6 kg)   LMP 03/30/2020   SpO2 100%   BMI 25.58 kg/m   Visual Acuity Right Eye Distance:   Left Eye Distance:   Bilateral Distance:    Right Eye Near:   Left Eye Near:    Bilateral Near:     Physical Exam General appearance: alert, well developed, well nourished, cooperative and in no distress Head: Normocephalic, without obvious abnormality, atraumatic Respiratory: Respirations even and unlabored, normal respiratory rate Heart: rate and rhythm normal. No gallop or murmurs noted on exam  Abdomen: BS +, no distention, no rebound tenderness, or no mass, negative CVA tenderness Extremities: No gross deformities Skin: Skin color, texture, turgor normal. No rashes seen  Psych: Appropriate mood and affect. Neurologic:  Mental status: Alert, oriented to person, place, and time, thought content appropriate.  UC Treatments / Results  Labs (all labs ordered are listed, but only abnormal results are displayed) Labs Reviewed  URINE CULTURE  POCT URINALYSIS DIPSTICK, ED / UC  POC URINE PREG, ED    EKG   Radiology No results found.  Procedures Procedures (including critical care time)  Medications Ordered in UC Medications - No data to display  Initial Impression / Assessment and Plan / UC Course  I have reviewed the triage vital signs and the nursing notes.  Pertinent labs & imaging results that were available during my care of the patient were reviewed by me and considered in my medical decision making (see chart for details).      UA significant for acute cystitis, will treat with Ciprofloxacin given recent treatment failure with  Macrobid. Afebrile, no concern for pyelonephritis. Culture pending. Urine HCG negative.  An After Visit Summary was printed and given to the patient/family. Precautions discussed. Red flags discussed. Questions invited and answered. They voiced understanding and agreement.  Final Clinical Impressions(s) / UC Diagnoses   Final diagnoses:  Acute cystitis with hematuria     Discharge Instructions     If your symptoms do not improve with this course of antibiotic treatment return for immediate follow-up given the close proximity to the time as you have had your last UTI.  Urine culture is pending therefore if any medication needs to be changed we will reach out to you via MyChart.    ED Prescriptions    Medication Sig Dispense Auth. Provider   ciprofloxacin (CIPRO) 500 MG tablet  (Status: Discontinued) Take 1 tablet (500 mg total) by mouth 2 (two) times daily. 14 tablet 05/31/2020, FNP   ciprofloxacin (CIPRO) 500 MG tablet Take 1 tablet (500 mg total) by mouth 2 (two) times daily. 14 tablet Bing Neighbors, FNP     PDMP not reviewed this  encounter.   Bing Neighbors, FNP 04/27/20 1659

## 2020-04-27 NOTE — ED Notes (Signed)
Urinalysis testing exceeded the limitations of the [employer] capabilities. Notified provider. Urine culture ordered 

## 2020-04-29 LAB — URINE CULTURE
Culture: 80000 — AB
Special Requests: NORMAL

## 2020-06-10 ENCOUNTER — Encounter (HOSPITAL_COMMUNITY): Payer: Self-pay

## 2020-06-10 ENCOUNTER — Ambulatory Visit (HOSPITAL_COMMUNITY)
Admission: EM | Admit: 2020-06-10 | Discharge: 2020-06-10 | Disposition: A | Discharge status: Home / Self Care | Payer: No Typology Code available for payment source | Attending: Family Medicine | Admitting: Family Medicine

## 2020-06-10 ENCOUNTER — Other Ambulatory Visit: Payer: Self-pay

## 2020-06-10 DIAGNOSIS — R519 Headache, unspecified: Secondary | ICD-10-CM | POA: Insufficient documentation

## 2020-06-10 DIAGNOSIS — H9209 Otalgia, unspecified ear: Secondary | ICD-10-CM | POA: Insufficient documentation

## 2020-06-10 DIAGNOSIS — N3090 Cystitis, unspecified without hematuria: Secondary | ICD-10-CM | POA: Diagnosis not present

## 2020-06-10 DIAGNOSIS — Z20822 Contact with and (suspected) exposure to covid-19: Secondary | ICD-10-CM | POA: Insufficient documentation

## 2020-06-10 MED ORDER — CEPHALEXIN 500 MG PO CAPS
500.0000 mg | ORAL_CAPSULE | Freq: Two times a day (BID) | ORAL | 0 refills | Status: DC
Start: 2020-06-10 — End: 2020-10-23

## 2020-06-10 MED ORDER — ONDANSETRON 4 MG PO TBDP
4.0000 mg | ORAL_TABLET | Freq: Three times a day (TID) | ORAL | 0 refills | Status: DC | PRN
Start: 2020-06-10 — End: 2021-07-01

## 2020-06-10 NOTE — ED Triage Notes (Signed)
Pt c/o HA, bilateral ear pain, body aches and pain for approx 3 days; nausea today. Also reports urinary frequency, urgency, irritation with urination, right lumbar back pain for approx 2 days.  Denies fever, chills, v/d, abdominal pain.  Has been taking AZO for approx 2 weeks.

## 2020-06-10 NOTE — Discharge Instructions (Signed)
You have been tested for COVID-19 today. °If your test returns positive, you will receive a phone call from [facility] regarding your results. °Negative test results are not called. °Both positive and negative results area always visible on MyChart. °If you do not have a MyChart account, sign up instructions are provided in your discharge papers. °Please do not hesitate to contact us should you have questions or concerns. ° °

## 2020-06-11 LAB — URINE CULTURE: Culture: NO GROWTH

## 2020-06-11 LAB — SARS CORONAVIRUS 2 (TAT 6-24 HRS): SARS Coronavirus 2: NEGATIVE

## 2020-06-15 NOTE — ED Provider Notes (Signed)
[facility]   [id number] 06/10/20 Arrival Time: 1921  ASSESSMENT & PLAN:  1. Cystitis   2. Nonintractable headache, unspecified chronicity pattern, unspecified headache type      COVID-19 testing sent. See letter/work note on file for self-isolation guidelines. OTC symptom care as needed.  Will tx for UTI based on symptoms. Culture sent. No signs of pyelonephritis. Meds ordered this encounter  Medications  . ondansetron (ZOFRAN-ODT) 4 MG disintegrating tablet    Sig: Take 1 tablet (4 mg total) by mouth every 8 (eight) hours as needed for nausea or vomiting.    Dispense:  15 tablet    Refill:  0  . cephALEXin (KEFLEX) 500 MG capsule    Sig: Take 1 capsule (500 mg total) by mouth 2 (two) times daily.    Dispense:  10 capsule    Refill:  0      Follow-up Information    [facility].   Specialty: Urgent Care Why: As needed. Contact information: [address] [place] [address] [phone]              Reviewed expectations re: course of current medical issues. Questions answered. Outlined signs and symptoms indicating need for more acute intervention. Understanding verbalized. After Visit Summary given.   SUBJECTIVE: History from: patient. Joyce Hernandez is a 24 y.o. female who presents with worries regarding COVID-19. HA, otalgia, body aches; 2-3 days. Mild nausea today. Also with urinary frequency and R low back soreness for a couple of days. Known COVID-19 contact: none. Recent travel: none. Denies: fever. Normal PO intake without d.    OBJECTIVE:  Vitals:   06/10/20 2042  BP: 126/76  Pulse: 91  Resp: 18  Temp: 100.1 F (37.8 C)  TempSrc: Oral  SpO2: 100%    General appearance: alert; no distress Eyes: PERRLA; EOMI; conjunctiva normal HENT: [place]; AT; with mild nasal congestion; TMs normal Neck: supple  Lungs: speaks full sentences without difficulty; unlabored Extremities: no edema Skin:  warm and dry Neurologic: normal gait Psychological: alert and cooperative; normal mood and affect  Labs:  Labs Reviewed  SARS CORONAVIRUS 2 (TAT 6-24 HRS)  URINE CULTURE      Allergies  Allergen Reactions  . No Known Allergies     Past Medical History:  Diagnosis Date  . Bacterial vaginosis   . UTI (urinary tract infection)   . Yeast infection    Social History   Socioeconomic History  . Marital status: Single    Spouse name: Not on file  . Number of children: Not on file  . Years of education: Not on file  . Highest education level: Not on file  Occupational History  . Not on file  Tobacco Use  . Smoking status: Never Smoker  . Smokeless tobacco: Never Used  Vaping Use  . Vaping Use: Never used  Substance and Sexual Activity  . Alcohol use: Yes    Comment: occasionally  . Drug use: No  . Sexual activity: Yes    Partners: Male    Birth control/protection: None  Other Topics Concern  . Not on file  Social History Narrative  . Not on file   Social Determinants of Health   Financial Resource Strain:   . Difficulty of Paying Living Expenses: Not on file  Food Insecurity:   . Worried About Programme researcher, broadcasting/film/video in the Last Year: Not on file  . Ran Out of Food in the Last Year: Not on  file  Transportation Needs:   . Freight forwarder (Medical): Not on file  . Lack of Transportation (Non-Medical): Not on file  Physical Activity:   . Days of Exercise per Week: Not on file  . Minutes of Exercise per Session: Not on file  Stress:   . Feeling of Stress : Not on file  Social Connections:   . Frequency of Communication with Friends and Family: Not on file  . Frequency of Social Gatherings with Friends and Family: Not on file  . Attends Religious Services: Not on file  . Active Member of Clubs or Organizations: Not on file  . Attends Banker Meetings: Not on file  . Marital Status: Not on file  Intimate Partner Violence:   . Fear of Current  or Ex-Partner: Not on file  . Emotionally Abused: Not on file  . Physically Abused: Not on file  . Sexually Abused: Not on file   Family History  Problem Relation Age of Onset  . Healthy Mother   . Healthy Father    Past Surgical History:  Procedure Laterality Date  . BREAST SURGERY     lymph node removal  . LYMPH NODE DISSECTION       Mardella Layman, MD 06/15/20 [phone]

## 2020-10-22 ENCOUNTER — Ambulatory Visit (HOSPITAL_COMMUNITY)
Admission: EM | Admit: 2020-10-22 | Discharge: 2020-10-22 | Disposition: A | Discharge status: Home / Self Care | Payer: No Typology Code available for payment source | Attending: Internal Medicine | Admitting: Internal Medicine

## 2020-10-22 ENCOUNTER — Other Ambulatory Visit: Payer: Self-pay

## 2020-10-22 ENCOUNTER — Encounter (HOSPITAL_COMMUNITY): Payer: Self-pay | Admitting: Emergency Medicine

## 2020-10-22 DIAGNOSIS — Z113 Encounter for screening for infections with a predominantly sexual mode of transmission: Secondary | ICD-10-CM | POA: Diagnosis present

## 2020-10-22 LAB — POCT URINALYSIS DIPSTICK, ED / UC
Bilirubin Urine: NEGATIVE
Glucose, UA: NEGATIVE mg/dL
Hgb urine dipstick: NEGATIVE
Ketones, ur: NEGATIVE mg/dL
Leukocytes,Ua: NEGATIVE
Nitrite: NEGATIVE
Protein, ur: NEGATIVE mg/dL
Specific Gravity, Urine: >=1.03 (ref 1.005–1.030)
Urobilinogen, UA: 0.2 mg/dL (ref 0.0–1.0)
pH: 6 (ref 5.0–8.0)

## 2020-10-22 LAB — HIV ANTIBODY (ROUTINE TESTING W REFLEX): HIV Screen 4th Generation wRfx: NONREACTIVE

## 2020-10-22 NOTE — Discharge Instructions (Addendum)
Labs results pending will return in 2-3 days. Will upload to mychart if active. You will be called if any results are positive. Practice abstinence until results return.

## 2020-10-22 NOTE — ED Triage Notes (Signed)
Pt presents with dysuria, vaginal irritation, and discharge xs 1 week. States has been taking cranberry pills with some relief.

## 2020-10-22 NOTE — ED Provider Notes (Signed)
[facility]    CSN: [id number] Arrival date & time: 10/22/20  9937      History   Chief Complaint Chief Complaint  Patient presents with  . Vaginal Itching  . Dysuria  . Vaginal Discharge    HPI Joyce Hernandez is a 25 y.o. female.   Presents with vaginal itching, odor and discharge starting 1 week ago. Discharge is thin and white. Taking cranberry pills which relieved the odor. Denies dysuria, frequency, CVA tenderness. Fever, chills, N/V. Possibly exposed to STI due to partner. History of reoccurring UTI, last occurrence 05/2020 and STIs.    Past Medical History:  Diagnosis Date  . Bacterial vaginosis   . UTI (urinary tract infection)   . Yeast infection     Patient Active Problem List   Diagnosis Date Noted  . Low grade squamous intraepithelial lesion (LGSIL) on cervical Pap smear 08/04/2019    Past Surgical History:  Procedure Laterality Date  . BREAST SURGERY     lymph node removal  . LYMPH NODE DISSECTION      OB History    Gravida  0   Para  0   Term  0   Preterm  0   AB  0   Living  0     SAB  0   IAB  0   Ectopic  0   Multiple  0   Live Births  0            Home Medications    Prior to Admission medications   Medication Sig Start Date End Date Taking? Authorizing Provider  cephALEXin (KEFLEX) 500 MG capsule Take 1 capsule (500 mg total) by mouth 2 (two) times daily. 06/10/20   Mardella Layman, MD  ciprofloxacin (CIPRO) 500 MG tablet Take 1 tablet (500 mg total) by mouth 2 (two) times daily. 04/27/20   Bing Neighbors, FNP  ibuprofen (ADVIL) 800 MG tablet Take 1 tablet (800 mg total) by mouth 3 (three) times daily. 09/29/19   Wieters, Hallie C, PA-C  ondansetron (ZOFRAN-ODT) 4 MG disintegrating tablet Take 1 tablet (4 mg total) by mouth every 8 (eight) hours as needed for nausea or vomiting. 06/10/20   Mardella Layman, MD  Cetirizine HCl 10 MG CAPS Take 1 capsule (10 mg total) by mouth daily. 09/29/19 01/27/20  Wieters, Hallie  C, PA-C  fluticasone (FLONASE) 50 MCG/ACT nasal spray Place 1-2 sprays into both nostrils daily for 7 days. 09/29/19 01/27/20  Wieters, Hallie C, PA-C  Levonorgestrel-Ethinyl Estradiol (AMETHIA) 0.15-0.03 &0.01 MG tablet Take 1 tablet by mouth daily. Patient not taking: Reported on 08/29/2019 07/25/19 09/29/19  Gerrit Heck, CNM  omeprazole (PRILOSEC) 20 MG capsule Take 1 capsule (20 mg total) by mouth daily. Patient not taking: Reported on 07/25/2019 03/30/18 07/31/19  Eustace Moore, MD    Family History Family History  Problem Relation Age of Onset  . Healthy Mother   . Healthy Father     Social History Social History   Tobacco Use  . Smoking status: Never Smoker  . Smokeless tobacco: Never Used  Vaping Use  . Vaping Use: Never used  Substance Use Topics  . Alcohol use: Yes    Comment: occasionally  . Drug use: No     Allergies   No known allergies   Review of Systems Review of Systems  Constitutional: Negative.   Genitourinary: Positive for vaginal discharge. Negative for decreased urine volume, difficulty urinating, dyspareunia, dysuria, enuresis, flank pain, frequency, genital sores, hematuria,  menstrual problem, pelvic pain, urgency, vaginal bleeding and vaginal pain.       Vaginal odor, vaginal itching  Skin: Negative.      Physical Exam Triage Vital Signs ED Triage Vitals  Enc Vitals Group     BP 10/22/20 0824 126/82     Pulse Rate 10/22/20 0824 81     Resp 10/22/20 0824 17     Temp 10/22/20 0824 98.1 F (36.7 C)     Temp Source 10/22/20 0824 Oral     SpO2 10/22/20 0824 98 %     Weight --      Height --      Head Circumference --      Peak Flow --      Pain Score 10/22/20 0823 0     Pain Loc --      Pain Edu? --      Excl. in GC? --    No data found.  Updated Vital Signs BP 126/82 (BP Location: Right Arm)   Pulse 81   Temp 98.1 F (36.7 C) (Oral)   Resp 17   LMP 10/11/2020   SpO2 98%   Visual Acuity Right Eye Distance:   Left Eye  Distance:   Bilateral Distance:    Right Eye Near:   Left Eye Near:    Bilateral Near:     Physical Exam Vitals and nursing note reviewed.  Constitutional:      Appearance: Normal appearance. She is normal weight.  HENT:     Head: Normocephalic.  Eyes:     Extraocular Movements: Extraocular movements intact.  Pulmonary:     Effort: Pulmonary effort is normal.  Genitourinary:    Comments: Vaginal exam deferred. Self collect for swab Musculoskeletal:        General: Normal range of motion.     Cervical back: Normal range of motion.  Skin:    General: Skin is warm and dry.  Neurological:     General: No focal deficit present.     Mental Status: She is alert and oriented to person, place, and time. Mental status is at baseline.  Psychiatric:        Mood and Affect: Mood normal.        Behavior: Behavior normal.        Thought Content: Thought content normal.        Judgment: Judgment normal.      UC Treatments / Results  Labs (all labs ordered are listed, but only abnormal results are displayed) Labs Reviewed  URINALYSIS, ROUTINE W REFLEX MICROSCOPIC  RPR  HIV ANTIBODY (ROUTINE TESTING W REFLEX)  POCT URINALYSIS DIPSTICK, ED / UC  CERVICOVAGINAL ANCILLARY ONLY    EKG   Radiology No results found.  Procedures Procedures (including critical care time)  Medications Ordered in UC Medications - No data to display  Initial Impression / Assessment and Plan / UC Course  I have reviewed the triage vital signs and the nursing notes.  Pertinent labs & imaging results that were available during my care of the patient were reviewed by me and considered in my medical decision making (see chart for details).  Routine screening for STI 1. Urinalysis for UTI r/o- negative 2. Vaginal swab and Blood work for STI completed 3. Maintain abstinence until blood work returns   Final Clinical Impressions(s) / UC Diagnoses   Final diagnoses:  Routine screening for STI  (sexually transmitted infection)     Discharge Instructions     Labs results  pending will return in 2-3 days. Will upload to mychart if active. You will be called if any results are positive. Practice abstinence until results return.    ED Prescriptions    None     PDMP not reviewed this encounter.   Valinda Hoar, NP 10/22/20 0900

## 2020-10-23 ENCOUNTER — Telehealth (HOSPITAL_COMMUNITY): Payer: Self-pay | Admitting: Emergency Medicine

## 2020-10-23 LAB — CERVICOVAGINAL ANCILLARY ONLY
Bacterial Vaginitis (gardnerella): POSITIVE — AB
Candida Glabrata: NEGATIVE
Candida Vaginitis: NEGATIVE
Chlamydia: NEGATIVE
Comment: NEGATIVE
Comment: NEGATIVE
Comment: NEGATIVE
Comment: NEGATIVE
Comment: NEGATIVE
Comment: NORMAL
Neisseria Gonorrhea: NEGATIVE
Trichomonas: NEGATIVE

## 2020-10-23 LAB — RPR: RPR Ser Ql: NONREACTIVE

## 2020-10-23 MED ORDER — METRONIDAZOLE 500 MG PO TABS: 500.0000 mg | ORAL_TABLET | Freq: Two times a day (BID) | ORAL | 0 refills | Status: DC

## 2021-02-04 ENCOUNTER — Encounter (HOSPITAL_COMMUNITY): Payer: Self-pay

## 2021-02-04 ENCOUNTER — Ambulatory Visit (HOSPITAL_COMMUNITY)
Admission: EM | Admit: 2021-02-04 | Discharge: 2021-02-04 | Disposition: A | Discharge status: Home / Self Care | Payer: Self-pay | Attending: Internal Medicine | Admitting: Internal Medicine

## 2021-02-04 ENCOUNTER — Other Ambulatory Visit: Payer: Self-pay

## 2021-02-04 DIAGNOSIS — M542 Cervicalgia: Secondary | ICD-10-CM | POA: Insufficient documentation

## 2021-02-04 DIAGNOSIS — B9689 Other specified bacterial agents as the cause of diseases classified elsewhere: Secondary | ICD-10-CM | POA: Insufficient documentation

## 2021-02-04 DIAGNOSIS — N760 Acute vaginitis: Secondary | ICD-10-CM | POA: Insufficient documentation

## 2021-02-04 LAB — TSH: TSH: 3.361 u[IU]/mL (ref 0.350–4.500)

## 2021-02-04 LAB — T4, FREE: Free T4: 1.15 ng/dL — ABNORMAL HIGH (ref 0.61–1.12)

## 2021-02-04 MED ORDER — METRONIDAZOLE 500 MG PO TABS: 500.0000 mg | ORAL_TABLET | Freq: Two times a day (BID) | ORAL | 0 refills | Status: DC

## 2021-02-04 NOTE — ED Triage Notes (Signed)
Pt c/o sore throat, swollen lymph nodes, rash on the chest, body aches and states she wants to be checked for BV. She states the sxs have been going on for 5 days.

## 2021-02-04 NOTE — Discharge Instructions (Addendum)
Take medications as prescribed If you have any worsening symptoms please return to the urgent care We will call you with recommendations if labs are abnormal.

## 2021-02-05 ENCOUNTER — Telehealth (HOSPITAL_COMMUNITY): Payer: Self-pay | Admitting: Emergency Medicine

## 2021-02-05 LAB — CERVICOVAGINAL ANCILLARY ONLY
Bacterial Vaginitis (gardnerella): POSITIVE — AB
Candida Glabrata: NEGATIVE
Candida Vaginitis: POSITIVE — AB
Comment: NEGATIVE
Comment: NEGATIVE
Comment: NEGATIVE

## 2021-02-05 MED ORDER — FLUCONAZOLE 150 MG PO TABS: 150.0000 mg | ORAL_TABLET | Freq: Once | ORAL | 0 refills | Status: AC

## 2021-02-09 NOTE — ED Provider Notes (Signed)
[facility]    CSN: [id number] Arrival date & time: 02/04/21  0807      History   Chief Complaint Chief Complaint  Patient presents with  . Sore Throat  . Rash  . Generalized Body Aches  . Vaginal Discharge    HPI Joyce Hernandez is a 25 y.o. female the urgent care with sore throat, rash on the chest, generalized body aches of 5 days duration.  Symptoms started insidiously and has been persistent.  No fever or chills.  No nausea or vomiting.  No sick contacts.  Patient also complains of thin vaginal discharge without itching.  She is sexually active and denies any deep dyspareunia.  No dysuria urgency or frequency.  Vaginal discharge is malodorous.Marland Kitchen   HPI  Past Medical History:  Diagnosis Date  . Bacterial vaginosis   . UTI (urinary tract infection)   . Yeast infection     Patient Active Problem List   Diagnosis Date Noted  . Low grade squamous intraepithelial lesion (LGSIL) on cervical Pap smear 08/04/2019    Past Surgical History:  Procedure Laterality Date  . BREAST SURGERY     lymph node removal  . LYMPH NODE DISSECTION      OB History    Gravida  0   Para  0   Term  0   Preterm  0   AB  0   Living  0     SAB  0   IAB  0   Ectopic  0   Multiple  0   Live Births  0            Home Medications    Prior to Admission medications   Medication Sig Start Date End Date Taking? Authorizing Provider  ibuprofen (ADVIL) 800 MG tablet Take 1 tablet (800 mg total) by mouth 3 (three) times daily. 09/29/19   Wieters, Hallie C, PA-C  metroNIDAZOLE (FLAGYL) 500 MG tablet Take 1 tablet (500 mg total) by mouth 2 (two) times daily. 02/04/21   Jansen Goodpasture, Britta Mccreedy, MD  ondansetron (ZOFRAN-ODT) 4 MG disintegrating tablet Take 1 tablet (4 mg total) by mouth every 8 (eight) hours as needed for nausea or vomiting. 06/10/20   Mardella Layman, MD  Cetirizine HCl 10 MG CAPS Take 1 capsule (10 mg total) by mouth daily. 09/29/19 01/27/20  Wieters, Hallie C,  PA-C  fluticasone (FLONASE) 50 MCG/ACT nasal spray Place 1-2 sprays into both nostrils daily for 7 days. 09/29/19 01/27/20  Wieters, Hallie C, PA-C  Levonorgestrel-Ethinyl Estradiol (AMETHIA) 0.15-0.03 &0.01 MG tablet Take 1 tablet by mouth daily. Patient not taking: Reported on 08/29/2019 07/25/19 09/29/19  Gerrit Heck, CNM  omeprazole (PRILOSEC) 20 MG capsule Take 1 capsule (20 mg total) by mouth daily. Patient not taking: Reported on 07/25/2019 03/30/18 07/31/19  Eustace Moore, MD    Family History Family History  Problem Relation Age of Onset  . Healthy Mother   . Healthy Father     Social History Social History   Tobacco Use  . Smoking status: Never Smoker  . Smokeless tobacco: Never Used  Vaping Use  . Vaping Use: Never used  Substance Use Topics  . Alcohol use: Yes    Comment: occasionally  . Drug use: No     Allergies   No known allergies   Review of Systems Review of Systems  Constitutional: Positive for activity change. Negative for chills, fatigue and fever.  HENT: Positive for congestion and sore throat.   Respiratory:  Positive for cough. Negative for shortness of breath and wheezing.   Gastrointestinal: Negative.  Negative for abdominal pain.  Genitourinary: Positive for vaginal discharge. Negative for dysuria, flank pain, frequency, hematuria, urgency, vaginal bleeding and vaginal pain.  Neurological: Positive for headaches.     Physical Exam Triage Vital Signs ED Triage Vitals  Enc Vitals Group     BP 02/04/21 0836 126/86     Pulse Rate 02/04/21 0836 67     Resp 02/04/21 0836 20     Temp 02/04/21 0836 98.1 F (36.7 C)     Temp Source 02/04/21 0836 Oral     SpO2 02/04/21 0836 99 %     Weight --      Height --      Head Circumference --      Peak Flow --      Pain Score 02/04/21 0835 4     Pain Loc --      Pain Edu? --      Excl. in GC? --    No data found.  Updated Vital Signs BP 126/86 (BP Location: Left Arm)   Pulse 67   Temp 98.1  F (36.7 C) (Oral)   Resp 20   LMP 01/23/2021 (Exact Date)   SpO2 99%   Visual Acuity Right Eye Distance:   Left Eye Distance:   Bilateral Distance:    Right Eye Near:   Left Eye Near:    Bilateral Near:     Physical Exam HENT:     Right Ear: Tympanic membrane normal.     Left Ear: Tympanic membrane normal.     Mouth/Throat:     Mouth: Mucous membranes are moist.     Pharynx: Posterior oropharyngeal erythema present.     Tonsils: No tonsillar exudate or tonsillar abscesses. 0 on the right. 0 on the left.  Neck:     Thyroid: Thyromegaly present.  Cardiovascular:     Rate and Rhythm: Normal rate and regular rhythm.  Pulmonary:     Effort: Pulmonary effort is normal.     Breath sounds: Normal breath sounds.  Abdominal:     General: Bowel sounds are normal.     Palpations: Abdomen is soft.  Musculoskeletal:     Cervical back: Normal range of motion and neck supple.  Lymphadenopathy:     Cervical: No cervical adenopathy.      UC Treatments / Results  Labs (all labs ordered are listed, but only abnormal results are displayed) Labs Reviewed  T4, FREE - Abnormal; Notable for the following components:      Result Value   Free T4 1.15 (*)    All other components within normal limits  CERVICOVAGINAL ANCILLARY ONLY - Abnormal; Notable for the following components:   Bacterial Vaginitis (gardnerella) Positive (*)    Candida Vaginitis Positive (*)    All other components within normal limits  TSH    EKG   Radiology No results found.  Procedures Procedures (including critical care time)  Medications Ordered in UC Medications - No data to display  Initial Impression / Assessment and Plan / UC Course  I have reviewed the triage vital signs and the nursing notes.  Pertinent labs & imaging results that were available during my care of the patient were reviewed by me and considered in my medical decision making (see chart for details).     1.  Neck pain with  thyroid enlargement: Free T4, TSH, this could be viral thyroiditis. She will need  a free T4 TSH in 4 to 6 weeks if thyroid function is abnormal  2.  Bacterial vaginosis: Metronidazole 500 mg twice daily for 7 days Avoid alcohol intake with bacterial vaginosis Sent vaginal swab for BV/yeast We will call you with recommendations if labs are abnormal.  Final Clinical Impressions(s) / UC Diagnoses   Final diagnoses:  BV (bacterial vaginosis)  Neck pain     Discharge Instructions     Take medications as prescribed If you have any worsening symptoms please return to the urgent care We will call you with recommendations if labs are abnormal.    ED Prescriptions    Medication Sig Dispense Auth. Provider   metroNIDAZOLE (FLAGYL) 500 MG tablet Take 1 tablet (500 mg total) by mouth 2 (two) times daily. 14 tablet Shawntay Prest, Britta Mccreedy, MD     PDMP not reviewed this encounter.   Merrilee Jansky, MD 02/09/21 [phone]

## 2021-03-17 ENCOUNTER — Telehealth: Payer: Self-pay | Admitting: Nurse Practitioner

## 2021-03-17 DIAGNOSIS — R1031 Right lower quadrant pain: Secondary | ICD-10-CM

## 2021-03-17 DIAGNOSIS — R194 Change in bowel habit: Secondary | ICD-10-CM

## 2021-03-17 NOTE — Progress Notes (Signed)
.  Based on what you shared with me it looks like you have change in bowels,that should be evaluated in a face to face office visit. Due to associated burning in right lower abdomen you will need a faceto face evaluation. You need urinalysis as well as possible pelvic exam.  NOTE: There will be NO CHARGE for this eVisit   If you are having a true medical emergency please call 911.      For an urgent face to face visit, [place] has six urgent care centers for your convenience:     [facility] Center at [facility] Directions [phone] [address] [place], [place] [address]    [facility] Health Urgent Care Center [facility]) Get Driving Directions [phone] [address] [place], [place] [address]  [facility] Care Center [facility]) Get Driving Directions [phone] [address] [place],  [place]  [address]  [facility] Health Urgent Care at [facility] Get Driving Directions [phone] [address], [address] [place], [place] [address]   [facility] Health Urgent Care at [facility] Get Driving Directions  [phone] [address] [place], [place] [address]   [facility] Health Urgent Care at [facility] Directions [phone] [address], [address] [place], [place] [address]  Your MyChart E-visit questionnaire answers were reviewed by a board certified advanced clinical practitioner to complete your personal care plan based on your specific symptoms.  Thank you for using e-Visits.

## 2021-06-10 ENCOUNTER — Ambulatory Visit (HOSPITAL_COMMUNITY): Payer: Self-pay

## 2021-06-12 ENCOUNTER — Encounter (HOSPITAL_COMMUNITY): Payer: Self-pay | Admitting: Emergency Medicine

## 2021-06-12 ENCOUNTER — Ambulatory Visit (HOSPITAL_COMMUNITY)
Admission: EM | Admit: 2021-06-12 | Discharge: 2021-06-12 | Disposition: A | Discharge status: Home / Self Care | Payer: 59 | Attending: Student | Admitting: Student

## 2021-06-12 ENCOUNTER — Other Ambulatory Visit: Payer: Self-pay

## 2021-06-12 DIAGNOSIS — Z3202 Encounter for pregnancy test, result negative: Secondary | ICD-10-CM | POA: Diagnosis not present

## 2021-06-12 DIAGNOSIS — Z113 Encounter for screening for infections with a predominantly sexual mode of transmission: Secondary | ICD-10-CM | POA: Diagnosis present

## 2021-06-12 DIAGNOSIS — N760 Acute vaginitis: Secondary | ICD-10-CM | POA: Insufficient documentation

## 2021-06-12 LAB — POCT URINALYSIS DIPSTICK, ED / UC
Bilirubin Urine: NEGATIVE
Glucose, UA: NEGATIVE mg/dL
Hgb urine dipstick: NEGATIVE
Ketones, ur: NEGATIVE mg/dL
Nitrite: POSITIVE — AB
Protein, ur: NEGATIVE mg/dL
Specific Gravity, Urine: 1.02 (ref 1.005–1.030)
Urobilinogen, UA: 0.2 mg/dL (ref 0.0–1.0)
pH: 7 (ref 5.0–8.0)

## 2021-06-12 LAB — POC URINE PREG, ED: Preg Test, Ur: NEGATIVE

## 2021-06-12 MED ORDER — FLUCONAZOLE 150 MG PO TABS: 150.0000 mg | ORAL_TABLET | Freq: Every day | ORAL | 0 refills | Status: DC

## 2021-06-12 NOTE — Discharge Instructions (Addendum)
-  For your yeast infection, start the Diflucan (fluconazole)- Take one pill today (day 1). If you're still having symptoms in 3 days, take the second pill.  -We have sent testing for BV, yeast, and sexually transmitted infections. We will notify you of any positive results once they are received. If required, we will prescribe any medications you might need in 2-3 days. Please refrain from all sexual activity until treatment is complete.  -Seek additional medical attention if you develop fevers/chills, new/worsening abdominal pain, new/worsening vaginal discomfort/discharge, etc.  -Your pregnancy test is negative

## 2021-06-12 NOTE — ED Triage Notes (Signed)
Noticed vaginal discharge 5 days ago.  Patient has diarrhea 3 days ago.  Patient has had one episode of diarrhea today.  Denies back pain, generalized abdominal pain.  Denies urinary issues.  No pcp, history of yeast and bv.  Patient thinks this is a yeast infection

## 2021-06-12 NOTE — ED Provider Notes (Signed)
[facility]    CSN: [id number] Arrival date & time: 06/12/21  1143      History   Chief Complaint Chief Complaint  Patient presents with   Vaginal Discharge    HPI Joyce Hernandez is a 25 y.o. female presenting with vaginal discharge for about 3 days.  Medical history BV, UTI, yeast infection.  Was last at our urgent care for BV and yeast 02/04/2021, treated successfully.  States same female partner but she is requesting STI screening.  Describes today's discharge as white, also external vaginal irritation and itching. Denies hematuria, dysuria, frequency, urgency, back pain, n/v/d/abd pain, fevers/chills, abdnormal vaginal rashes/lesions.    HPI  Past Medical History:  Diagnosis Date   Bacterial vaginosis    UTI (urinary tract infection)    Yeast infection     Patient Active Problem List   Diagnosis Date Noted   Low grade squamous intraepithelial lesion (LGSIL) on cervical Pap smear 08/04/2019    Past Surgical History:  Procedure Laterality Date   BREAST SURGERY     lymph node removal   LYMPH NODE DISSECTION      OB History     Gravida  0   Para  0   Term  0   Preterm  0   AB  0   Living  0      SAB  0   IAB  0   Ectopic  0   Multiple  0   Live Births  0            Home Medications    Prior to Admission medications   Medication Sig Start Date End Date Taking? Authorizing Provider  fluconazole (DIFLUCAN) 150 MG tablet Take 1 tablet (150 mg total) by mouth daily. -For your yeast infection, start the Diflucan (fluconazole)- Take one pill today (day 1). If you're still having symptoms in 3 days, take the second pill. 06/12/21  Yes Rhys Martini, PA-C  ibuprofen (ADVIL) 800 MG tablet Take 1 tablet (800 mg total) by mouth 3 (three) times daily. 09/29/19   Wieters, Hallie C, PA-C  ondansetron (ZOFRAN-ODT) 4 MG disintegrating tablet Take 1 tablet (4 mg total) by mouth every 8 (eight) hours as needed for nausea or vomiting. 06/10/20    Mardella Layman, MD  Cetirizine HCl 10 MG CAPS Take 1 capsule (10 mg total) by mouth daily. 09/29/19 01/27/20  Wieters, Hallie C, PA-C  fluticasone (FLONASE) 50 MCG/ACT nasal spray Place 1-2 sprays into both nostrils daily for 7 days. 09/29/19 01/27/20  Wieters, Hallie C, PA-C  Levonorgestrel-Ethinyl Estradiol (AMETHIA) 0.15-0.03 &0.01 MG tablet Take 1 tablet by mouth daily. Patient not taking: Reported on 08/29/2019 07/25/19 09/29/19  Gerrit Heck, CNM  omeprazole (PRILOSEC) 20 MG capsule Take 1 capsule (20 mg total) by mouth daily. Patient not taking: Reported on 07/25/2019 03/30/18 07/31/19  Eustace Moore, MD    Family History Family History  Problem Relation Age of Onset   Healthy Mother    Healthy Father     Social History Social History   Tobacco Use   Smoking status: Never   Smokeless tobacco: Never  Vaping Use   Vaping Use: Never used  Substance Use Topics   Alcohol use: Yes    Comment: occasionally   Drug use: No     Allergies   No known allergies   Review of Systems Review of Systems  Constitutional:  Negative for appetite change, chills, diaphoresis and fever.  Respiratory:  Negative for  shortness of breath.   Cardiovascular:  Negative for chest pain.  Gastrointestinal:  Negative for abdominal pain, blood in stool, constipation, diarrhea, nausea and vomiting.  Genitourinary:  Positive for vaginal discharge. Negative for decreased urine volume, difficulty urinating, dysuria, flank pain, frequency, genital sores, hematuria, urgency, vaginal bleeding and vaginal pain.  Musculoskeletal:  Negative for back pain.  Neurological:  Negative for dizziness, weakness and light-headedness.  All other systems reviewed and are negative.   Physical Exam Triage Vital Signs ED Triage Vitals  Enc Vitals Group     BP 06/12/21 1232 120/76     Pulse Rate 06/12/21 1232 81     Resp 06/12/21 1232 18     Temp 06/12/21 1232 98.3 F (36.8 C)     Temp src --      SpO2 06/12/21 1232  99 %     Weight --      Height --      Head Circumference --      Peak Flow --      Pain Score 06/12/21 1229 5     Pain Loc --      Pain Edu? --      Excl. in GC? --    No data found.  Updated Vital Signs BP 120/76 (BP Location: Left Arm)   Pulse 81   Temp 98.3 F (36.8 C)   Resp 18   LMP 05/26/2021   SpO2 99%   Visual Acuity Right Eye Distance:   Left Eye Distance:   Bilateral Distance:    Right Eye Near:   Left Eye Near:    Bilateral Near:     Physical Exam Vitals reviewed.  Constitutional:      General: She is not in acute distress.    Appearance: Normal appearance. She is not ill-appearing.  HENT:     Head: Normocephalic and atraumatic.     Mouth/Throat:     Mouth: Mucous membranes are moist.     Comments: Moist mucous membranes Eyes:     Extraocular Movements: Extraocular movements intact.     Pupils: Pupils are equal, round, and reactive to light.  Cardiovascular:     Rate and Rhythm: Normal rate and regular rhythm.     Heart sounds: Normal heart sounds.  Pulmonary:     Effort: Pulmonary effort is normal.     Breath sounds: Normal breath sounds. No wheezing, rhonchi or rales.  Abdominal:     General: Bowel sounds are normal. There is no distension.     Palpations: Abdomen is soft. There is no mass.     Tenderness: There is no abdominal tenderness. There is no right CVA tenderness, left CVA tenderness, guarding or rebound.  Genitourinary:    Comments: deferred Skin:    General: Skin is warm.     Capillary Refill: Capillary refill takes less than 2 seconds.     Comments: Good skin turgor  Neurological:     General: No focal deficit present.     Mental Status: She is alert and oriented to person, place, and time.  Psychiatric:        Mood and Affect: Mood normal.        Behavior: Behavior normal.     UC Treatments / Results  Labs (all labs ordered are listed, but only abnormal results are displayed) Labs Reviewed  POCT URINALYSIS DIPSTICK, ED  / UC - Abnormal; Notable for the following components:      Result Value   Nitrite POSITIVE (*)  Leukocytes,Ua TRACE (*)    All other components within normal limits  POC URINE PREG, ED  CERVICOVAGINAL ANCILLARY ONLY    EKG   Radiology No results found.  Procedures Procedures (including critical care time)  Medications Ordered in UC Medications - No data to display  Initial Impression / Assessment and Plan / UC Course  I have reviewed the triage vital signs and the nursing notes.  Pertinent labs & imaging results that were available during my care of the patient were reviewed by me and considered in my medical decision making (see chart for details).     This patient is a very pleasant 25 y.o. year old female presenting with vaginitis. Afebrile, nontachycardic, no reproducible abd pain or CVAT.  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  She endorses same female partner but is requesting STI screen.  Will send self-swab for G/C, trich, yeast, BV testing. Declines HIV, RPR. Safe sex precautions.   Will cover for yeast with Diflucan, can send additional treatment following test results.  ED return precautions discussed. Patient verbalizes understanding and agreement.   Coding Level 4 for review of past notes/labs, order and interpretation of labs today, and prescription drug management     Final Clinical Impressions(s) / UC Diagnoses   Final diagnoses:  Vaginitis and vulvovaginitis  Routine screening for STI (sexually transmitted infection)  Negative pregnancy test     Discharge Instructions      -For your yeast infection, start the Diflucan (fluconazole)- Take one pill today (day 1). If you're still having symptoms in 3 days, take the second pill.  -We have sent testing for BV, yeast, and sexually transmitted infections. We will notify you of any positive results once they are received. If required, we will prescribe any medications you  might need in 2-3 days. Please refrain from all sexual activity until treatment is complete.  -Seek additional medical attention if you develop fevers/chills, new/worsening abdominal pain, new/worsening vaginal discomfort/discharge, etc.  -Your pregnancy test is negative    ED Prescriptions     Medication Sig Dispense Auth. Provider   fluconazole (DIFLUCAN) 150 MG tablet Take 1 tablet (150 mg total) by mouth daily. -For your yeast infection, start the Diflucan (fluconazole)- Take one pill today (day 1). If you're still having symptoms in 3 days, take the second pill. 2 tablet Rhys Martini, PA-C      PDMP not reviewed this encounter.   Rhys Martini, PA-C 06/12/21 1301

## 2021-06-14 LAB — CERVICOVAGINAL ANCILLARY ONLY
Bacterial Vaginitis (gardnerella): POSITIVE — AB
Candida Glabrata: NEGATIVE
Candida Vaginitis: NEGATIVE
Chlamydia: NEGATIVE
Comment: NEGATIVE
Comment: NEGATIVE
Comment: NEGATIVE
Comment: NEGATIVE
Comment: NEGATIVE
Comment: NORMAL
Neisseria Gonorrhea: POSITIVE — AB
Trichomonas: NEGATIVE

## 2021-06-15 ENCOUNTER — Telehealth: Payer: 59 | Admitting: Nurse Practitioner

## 2021-06-15 ENCOUNTER — Ambulatory Visit (HOSPITAL_COMMUNITY)
Admission: EM | Admit: 2021-06-15 | Discharge: 2021-06-15 | Disposition: A | Discharge status: Home / Self Care | Payer: 59 | Attending: Student | Admitting: Student

## 2021-06-15 ENCOUNTER — Other Ambulatory Visit: Payer: Self-pay

## 2021-06-15 ENCOUNTER — Telehealth (HOSPITAL_COMMUNITY): Payer: Self-pay | Admitting: Student

## 2021-06-15 ENCOUNTER — Encounter (HOSPITAL_COMMUNITY): Payer: Self-pay | Admitting: Emergency Medicine

## 2021-06-15 DIAGNOSIS — A549 Gonococcal infection, unspecified: Secondary | ICD-10-CM | POA: Diagnosis not present

## 2021-06-15 MED ORDER — CEFTRIAXONE SODIUM 500 MG IJ SOLR
500.0000 mg | Freq: Once | INTRAMUSCULAR | Status: AC
  Administered 2021-06-15: 500 mg via INTRAMUSCULAR

## 2021-06-15 MED ORDER — CEFTRIAXONE SODIUM 500 MG IJ SOLR
INTRAMUSCULAR | Status: AC
  Filled 2021-06-15: qty 500

## 2021-06-15 MED ORDER — LIDOCAINE HCL (PF) 1 % IJ SOLN
INTRAMUSCULAR | Status: AC
  Filled 2021-06-15: qty 2

## 2021-06-15 MED ORDER — METRONIDAZOLE 500 MG PO TABS: 500.0000 mg | ORAL_TABLET | Freq: Two times a day (BID) | ORAL | 0 refills | Status: DC

## 2021-06-15 NOTE — Telephone Encounter (Signed)
Flagyl sent for BV.

## 2021-06-15 NOTE — Progress Notes (Signed)
Joyce Hernandez,  I see your results now and unfortunately that STI needs to be treated with an injection so you will need to be seen in person. You can either return to the Urgent Care that tested you or make an appointment with the health department nearest you for treatment.   I apologize for the inconvenience, we will not charge you for the visit since we cannot treat this.   Thank you Viviano Simas FNP-C   Based on what you shared with me, I feel your condition warrants further evaluation and I recommend that you be seen in a face to face visit.   NOTE: There will be NO CHARGE for this eVisit   If you are having a true medical emergency please call 911.      For an urgent face to face visit, [place] has six urgent care centers for your convenience:     [facility] Care Center at [facility] Directions [phone] [address] [place], [place] [address]    [facility] Health Urgent Care Center [facility]) Get Driving Directions [phone] [address] [place], [place] [address]  [facility] Health Urgent Care Center [facility]) Get Driving Directions [phone] [address] [place],  [place]  [address]  [facility] Health Urgent Care at [facility] Get Driving Directions [phone] [address] [place], [place] [address]   [facility] Health Urgent Care at [facility] Get Driving Directions  [phone] [address] [place], [place] [address]   [facility] Health Urgent Care at [facility] Directions [phone] [address], [address] [place], [place] [address]  Your MyChart E-visit questionnaire answers were reviewed by a board certified advanced clinical practitioner to complete your personal care plan based on your specific symptoms.  Thank you for using e-Visits.

## 2021-06-15 NOTE — ED Triage Notes (Signed)
Pt repots STD results came back positive for gonorrhea and needs treatment.

## 2021-06-16 ENCOUNTER — Telehealth: Payer: 59 | Admitting: Family

## 2021-06-16 DIAGNOSIS — R110 Nausea: Secondary | ICD-10-CM

## 2021-06-16 MED ORDER — ONDANSETRON HCL 4 MG PO TABS: 4.0000 mg | ORAL_TABLET | Freq: Three times a day (TID) | ORAL | 0 refills | Status: DC | PRN

## 2021-06-16 MED ORDER — MECLIZINE HCL 50 MG PO TABS: 50.0000 mg | ORAL_TABLET | Freq: Three times a day (TID) | ORAL | 0 refills | Status: DC | PRN

## 2021-06-16 NOTE — Progress Notes (Signed)
E Visit for Motion Sickness  We are sorry that you are not feeling well. Here is how we plan to help!  Based on what you have shared with me it looks like you have symptoms of motion sickness.  I have prescribed a medication that will help prevent or alleviate your symptoms:  Meclizine 25mg  by mouth three times per day as needed for nausea/motion sickness and zofran for nausea.    Prevention:  You might feel better if you keep your eyes focused on outside while you are in motion. For example, if you are in a car, sit in the front and look in the direction you are moving; if you are on a boat, stay on the deck and look to the horizon. This helps make what you see match the movement you are feeling, and so you are less likely to feel sick.  You should also avoid reading, watching a movie, texting or reading messages, or looking at things close to you inside the vehicle you are riding in.  Use the seat head rest. Lean your head against the back of the seat or head rest when traveling in vehicles with seats to minimize head movements.  On a ship: When making your reservations, choose a cabin in the middle of the ship and near the waterline. When on board, go up on deck and focus on the horizon.  In an airplane: Request a window seat and look out the window. A seat over the front edge of the wing is the most preferable spot (the degree of motion is the lowest here). Direct the air vent to blow cool air on your face.  On a train: Always face forward and sit near a window.  In a vehicle: Sit in the front seat; if you are the passenger, look at the scenery in the distance. For some people, driving the vehicle (rather than being a passenger) is an instant remedy.  Avoid others who have become nauseous with motion sickness. Seeing and smelling others who have motion sickness may cause you to become sick.  GET HELP RIGHT AWAY IF:  Your symptoms do not improve or worsen within 2 days after  treatment.  You cannot keep down fluids after trying the medication.  Other associated symptoms such as severe headache, visual field changes, fever, or intractable nausea and vomiting.  MAKE SURE YOU:  Understand these instructions. Will watch your condition. Will get help right away if you are not doing well or get worse.  Thank you for choosing an e-visit.  Your e-visit answers were reviewed by a board certified advanced clinical practitioner to complete your personal care plan. Depending upon the condition, your plan could have included both over the counter or prescription medications.  Please review your pharmacy choice. Be sure that the pharmacy you have chosen is open so that you can pick up your prescription now.  If there is a problem you may message your provider in MyChart to have the prescription routed to another pharmacy.  Your safety is important to . If you have drug allergies check your prescription carefully.   For the next 24 hours, you can use MyChart to ask questions about today's visit, request a non-urgent call back, or ask for a work or school excuse from your e-visit provider.  You will get an e-mail in the next two days asking about your experience. I hope that your e-visit has been valuable and will speed your recovery.   References or for  more information: https://cross.com/ https://my.https://rowe.info/ https://www.uptodate.com    Approximately 5 minutes was spent documenting and reviewing patient's chart.

## 2021-07-01 ENCOUNTER — Emergency Department (INDEPENDENT_AMBULATORY_CARE_PROVIDER_SITE_OTHER)
Admission: EM | Admit: 2021-07-01 | Discharge: 2021-07-01 | Disposition: A | Discharge status: Home / Self Care | Payer: 59 | Source: Home / Self Care

## 2021-07-01 ENCOUNTER — Other Ambulatory Visit: Payer: Self-pay

## 2021-07-01 ENCOUNTER — Emergency Department: Admit: 2021-07-01 | Discharge status: Absent without leave | Payer: Self-pay

## 2021-07-01 ENCOUNTER — Other Ambulatory Visit (HOSPITAL_COMMUNITY)
Admission: RE | Admit: 2021-07-01 | Discharge: 2021-07-01 | Disposition: A | Discharge status: Home / Self Care | Payer: 59 | Source: Ambulatory Visit | Attending: Family Medicine | Admitting: Family Medicine

## 2021-07-01 ENCOUNTER — Telehealth: Payer: 59

## 2021-07-01 ENCOUNTER — Telehealth: Payer: 59 | Admitting: Physician Assistant

## 2021-07-01 DIAGNOSIS — N898 Other specified noninflammatory disorders of vagina: Secondary | ICD-10-CM | POA: Insufficient documentation

## 2021-07-01 DIAGNOSIS — N760 Acute vaginitis: Secondary | ICD-10-CM

## 2021-07-01 LAB — POCT URINALYSIS DIP (MANUAL ENTRY)
Bilirubin, UA: NEGATIVE
Blood, UA: NEGATIVE
Glucose, UA: NEGATIVE mg/dL
Leukocytes, UA: NEGATIVE
Nitrite, UA: NEGATIVE
Protein Ur, POC: NEGATIVE mg/dL
Spec Grav, UA: 1.025 (ref 1.010–1.025)
Urobilinogen, UA: 0.2 E.U./dL
pH, UA: 6 (ref 5.0–8.0)

## 2021-07-01 LAB — POCT URINE PREGNANCY: Preg Test, Ur: NEGATIVE

## 2021-07-01 NOTE — ED Triage Notes (Signed)
Pt c/o abnormal vaginal discharge x 3 days. Previously seen in UC for BV and ghonorrhea. TX completed. Hx of BV. Had televisit this am, referred to in person UC for eval.

## 2021-07-01 NOTE — ED Provider Notes (Signed)
[facility]    CSN: [id number] Arrival date & time: 07/01/21  1341      History   Chief Complaint Chief Complaint  Patient presents with   Vaginal Discharge    HPI Joyce Hernandez is a 25 y.o. female.   HPI 25 year old female presents with vaginal discharge x3 days.  Reports was evaluated previously for BV and gonorrhea.  Patient reports completing treatment of BV.  PMH significant for bacterial vaginosis, UTI, and a yeast infection.  Patient reports had video visit with PCP and was advised to come here for further evaluation.  Patient was evaluated and treated for similar symptoms on 06/12/2021.  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 Authorizing Provider  ibuprofen (ADVIL) 800 MG tablet Take 1 tablet (800 mg total) by mouth 3 (three) times daily. 09/29/19   Wieters, Hallie C, PA-C  Cetirizine HCl 10 MG CAPS Take 1 capsule (10 mg total) by mouth daily. 09/29/19 01/27/20  Wieters, Hallie C, PA-C  fluticasone (FLONASE) 50 MCG/ACT nasal spray Place 1-2 sprays into both nostrils daily for 7 days. 09/29/19 01/27/20  Wieters, Hallie C, PA-C  Levonorgestrel-Ethinyl Estradiol (AMETHIA) 0.15-0.03 &0.01 MG tablet Take 1 tablet by mouth daily. Patient not taking: Reported on 08/29/2019 07/25/19 09/29/19  Gerrit Heck, CNM  omeprazole (PRILOSEC) 20 MG capsule Take  1 capsule (20 mg total) by mouth daily. Patient not taking: Reported on 07/25/2019 03/30/18 07/31/19  Eustace Moore, MD    Family History Family History  Problem Relation Age of Onset   Healthy Mother    Healthy Father     Social History Social History   Tobacco Use   Smoking status: Never   Smokeless tobacco: Never  Vaping Use   Vaping Use: Never used  Substance Use Topics   Alcohol use: Yes    Comment: occasionally   Drug use: No     Allergies   No known allergies   Review of Systems Review of Systems  All other systems reviewed and are negative.   Physical Exam Triage Vital Signs ED Triage Vitals  Enc Vitals Group     BP 07/01/21 1501 124/69     Pulse Rate 07/01/21 1501 65     Resp 07/01/21 1501 17     Temp 07/01/21 1501 98.4 F (36.9 C)     Temp Source 07/01/21 1501 Oral     SpO2 07/01/21 1501 100 %     Weight --      Height --      Head Circumference --  Peak Flow --      Pain Score 07/01/21 1420 0     Pain Loc --      Pain Edu? --      Excl. in GC? --    No data found.  Updated Vital Signs BP 124/69 (BP Location: Right Arm)   Pulse 65   Temp 98.4 F (36.9 C) (Oral)   Resp 17   LMP 06/19/2021 (Exact Date)   SpO2 100%    Physical Exam Vitals and nursing note reviewed.  Constitutional:      Appearance: Normal appearance. She is normal weight.  HENT:     Head: Normocephalic and atraumatic.     Mouth/Throat:     Mouth: Mucous membranes are moist.     Pharynx: Oropharynx is clear.  Eyes:     Extraocular Movements: Extraocular movements intact.     Conjunctiva/sclera: Conjunctivae normal.     Pupils: Pupils are equal, round, and reactive to light.  Cardiovascular:     Rate and Rhythm: Normal rate and regular rhythm.     Pulses: Normal pulses.     Heart sounds: Normal heart sounds.  Pulmonary:     Effort: Pulmonary effort is normal.     Breath sounds: Normal breath sounds.  Musculoskeletal:        General: Normal range of  motion.     Cervical back: Normal range of motion and neck supple.  Skin:    General: Skin is warm and dry.  Neurological:     General: No focal deficit present.     Mental Status: She is alert and oriented to person, place, and time. Mental status is at baseline.  Psychiatric:        Mood and Affect: Mood normal.        Behavior: Behavior normal.        Thought Content: Thought content normal.     UC Treatments / Results  Labs (all labs ordered are listed, but only abnormal results are displayed) Labs Reviewed  POCT URINALYSIS DIP (MANUAL ENTRY) - Abnormal; Notable for the following components:      Result Value   Clarity, UA cloudy (*)    Ketones, POC UA moderate (40) (*)    All other components within normal limits  POCT URINE PREGNANCY  CERVICOVAGINAL ANCILLARY ONLY    EKG   Radiology No results found.  Procedures Procedures (including critical care time)  Medications Ordered in UC Medications - No data to display  Initial Impression / Assessment and Plan / UC Course  I have reviewed the triage vital signs and the nursing notes.  Pertinent labs & imaging results that were available during my care of the patient were reviewed by me and considered in my medical decision making (see chart for details).     MDM: 1.  Vaginal discharge-Aptima swab ordered. Advised patient we will follow-up with Aptima swab results once received.  Patient discharged home, hemodynamically stable. Final Clinical Impressions(s) / UC Diagnoses   Final diagnoses:  Vaginal discharge     Discharge Instructions      Advised patient we will follow-up with Aptima swab results once received.     ED Prescriptions   None    PDMP not reviewed this encounter.   Trevor Iha, FNP 07/01/21 [phone]

## 2021-07-01 NOTE — Progress Notes (Signed)
Virtual Visit Consent   Joyce Hernandez, you are scheduled for a virtual visit with a [facility] provider today.     Just as with appointments in the office, your consent must be obtained to participate.  Your consent will be active for this visit and any virtual visit you may have with one of our providers in the next 365 days.     If you have a MyChart account, a copy of this consent can be sent to you electronically.  All virtual visits are billed to your insurance company just like a traditional visit in the office.    As this is a virtual visit, video technology does not allow for your provider to perform a traditional examination.  This may limit your provider's ability to fully assess your condition.  If your provider identifies any concerns that need to be evaluated in person or the need to arrange testing (such as labs, EKG, etc.), we will make arrangements to do so.     Although advances in technology are sophisticated, we cannot ensure that it will always work on either your end or our end.  If the connection with a video visit is poor, the visit may have to be switched to a telephone visit.  With either a video or telephone visit, we are not always able to ensure that we have a secure connection.     I need to obtain your verbal consent now.   Are you willing to proceed with your visit today?    [name] has provided verbal consent on 07/01/2021 for a virtual visit (video or telephone).   Joyce Hernandez, [place]   Date: 07/01/2021 12:24 PM   Virtual Visit via Video Note   I, Joyce Hernandez, connected with  Joyce Hernandez  ([id number], [birth year]) on 07/01/21 at 12:15 PM EDT by a video-enabled telemedicine application and verified that I am speaking with the correct person using two identifiers.  Location: Patient: Virtual Visit Location Patient: Home Provider: Virtual Visit Location Provider: Home Office   I discussed the limitations of evaluation and  management by telemedicine and the availability of in person appointments. The patient expressed understanding and agreed to proceed.    History of Present Illness: Joyce Hernandez is a 25 y.o. who identifies as a female who was assigned female at birth, and is being seen today for recurrence of vaginal discharge x 1 day after recent treatment for gonorrhea with IM Rocephin and treatment for BV with Flagyl. She was also given two doses of Diflucan to take after completion of antibiotic. Notes recurrence as of this morning. Just thick discharge without any major odor, itch, pain. Denies any fever, chills, urinary changes or abdominal pain.   HPI: HPI  Problems:  Patient Active Problem List   Diagnosis Date Noted   Low grade squamous intraepithelial lesion (LGSIL) on cervical Pap smear 08/04/2019    Allergies:  Allergies  Allergen Reactions   No Known Allergies    Medications:  Current Outpatient Medications:    ibuprofen (ADVIL) 800 MG tablet, Take 1 tablet (800 mg total) by mouth 3 (three) times daily., Disp: 21 tablet, Rfl: 0  Observations/Objective: Patient is well-developed, well-nourished in no acute distress.  Resting comfortably at home.  Head is normocephalic, atraumatic.  No labored breathing. Speech is clear and coherent with logical content.  Patient is alert and oriented at baseline.   Assessment and Plan: 1. Recurrent vaginitis Patient sent for in-office evaluation and repeat testing giving recent  STI and recurrent symptoms.   No charge for visit.   Follow Up Instructions: I discussed the assessment and treatment plan with the patient. The patient was provided an opportunity to ask questions and all were answered. The patient agreed with the plan and demonstrated an understanding of the instructions.  A copy of instructions were sent to the patient via MyChart unless otherwise noted below.   The patient was advised to call back or seek an in-person evaluation if the  symptoms worsen or if the condition fails to improve as anticipated.  Time:  I spent 10 minutes with the patient via telehealth technology discussing the above problems/concerns.    Joyce Climes, PA-C

## 2021-07-01 NOTE — Discharge Instructions (Addendum)
Advised patient we will follow-up with Aptima swab results once received. 

## 2021-07-01 NOTE — Patient Instructions (Addendum)
  [name], thank you for joining [name], PA-C for today's virtual visit.  While this provider is not your primary care provider (PCP), if your PCP is located in our provider database this encounter information will be shared with them immediately following your visit.  Consent: (Patient) Joyce Hernandez provided verbal consent for this virtual visit at the beginning of the encounter.  Current Medications:  Current Outpatient Medications:    fluconazole (DIFLUCAN) 150 MG tablet, Take 1 tablet (150 mg total) by mouth daily. -For your yeast infection, start the Diflucan (fluconazole)- Take one pill today (day 1). If you're still having symptoms in 3 days, take the second pill., Disp: 2 tablet, Rfl: 0   ibuprofen (ADVIL) 800 MG tablet, Take 1 tablet (800 mg total) by mouth 3 (three) times daily., Disp: 21 tablet, Rfl: 0   meclizine (ANTIVERT) 50 MG tablet, Take 1 tablet (50 mg total) by mouth 3 (three) times daily as needed., Disp: 30 tablet, Rfl: 0   metroNIDAZOLE (FLAGYL) 500 MG tablet, Take 1 tablet (500 mg total) by mouth 2 (two) times daily., Disp: 14 tablet, Rfl: 0   ondansetron (ZOFRAN) 4 MG tablet, Take 1 tablet (4 mg total) by mouth every 8 (eight) hours as needed for nausea or vomiting., Disp: 20 tablet, Rfl: 0   ondansetron (ZOFRAN-ODT) 4 MG disintegrating tablet, Take 1 tablet (4 mg total) by mouth every 8 (eight) hours as needed for nausea or vomiting., Disp: 15 tablet, Rfl: 0   Medications ordered in this encounter:  No orders of the defined types were placed in this encounter.    *If you need refills on other medications prior to your next appointment, please contact your pharmacy*  Follow-Up: Call back or seek an in-person evaluation if the symptoms worsen or if the condition fails to improve as anticipated.  Other Instructions Please be seen in person as directed to make sure they identify the true cause of discharge recurrence, so the proper treatment can  be given. Continue to refrain from sexual activity until you get treated.   If you have been instructed to have an in-person evaluation today at a local Urgent Care facility, please use the link below. It will take you to a list of all of our available [facility], including address, phone number and hours of operation. Please do not delay care.  [facility]  If you or a family member do not have a primary care provider, use the link below to schedule a visit and establish care. When you choose a  [facility] primary care physician or advanced practice provider, you gain a long-term partner in health. Find a Primary Care Provider  Learn more about [facility]'s in-office and virtual care options:  - Get Care Now

## 2021-07-02 LAB — CERVICOVAGINAL ANCILLARY ONLY
Bacterial Vaginitis (gardnerella): NEGATIVE
Candida Glabrata: NEGATIVE
Candida Vaginitis: NEGATIVE
Chlamydia: NEGATIVE
Comment: NEGATIVE
Comment: NEGATIVE
Comment: NEGATIVE
Comment: NEGATIVE
Comment: NEGATIVE
Comment: NORMAL
Neisseria Gonorrhea: NEGATIVE
Trichomonas: NEGATIVE

## 2021-07-22 ENCOUNTER — Ambulatory Visit: Payer: 59

## 2021-07-28 ENCOUNTER — Encounter (HOSPITAL_COMMUNITY): Payer: Self-pay

## 2021-07-28 ENCOUNTER — Ambulatory Visit (HOSPITAL_COMMUNITY)
Admission: EM | Admit: 2021-07-28 | Discharge: 2021-07-28 | Disposition: A | Discharge status: Home / Self Care | Payer: 59 | Attending: Family Medicine | Admitting: Family Medicine

## 2021-07-28 ENCOUNTER — Other Ambulatory Visit: Payer: Self-pay

## 2021-07-28 DIAGNOSIS — N898 Other specified noninflammatory disorders of vagina: Secondary | ICD-10-CM | POA: Diagnosis not present

## 2021-07-28 MED ORDER — METRONIDAZOLE 500 MG PO TABS: 500.0000 mg | ORAL_TABLET | Freq: Two times a day (BID) | ORAL | 0 refills | Status: DC

## 2021-07-28 NOTE — Discharge Instructions (Signed)
We have sent testing for causes of vaginal infections. We will notify you of any positive results once they are received. If required, we will prescribe any medications you might need.  Please refrain from all sexual activity for at least the next seven days.  

## 2021-07-28 NOTE — ED Triage Notes (Signed)
Pt presents with concern for bv. Pt has had vaginal discharge x 2 weeks with a distinct odor. Requesting std testing.

## 2021-07-28 NOTE — ED Provider Notes (Signed)
  [facility]   [id number] 07/28/21 Arrival Time: 1546  ASSESSMENT & PLAN:  1. Vaginal discharge    Meds ordered this encounter  Medications   metroNIDAZOLE (FLAGYL) 500 MG tablet    Sig: Take 1 tablet (500 mg total) by mouth 2 (two) times daily.    Dispense:  14 tablet    Refill:  0       Discharge Instructions      We have sent testing for causes of vaginal infections. We will notify you of any positive results once they are received. If required, we will prescribe any medications you might need.  Please refrain from all sexual activity for at least the next seven days.     Without s/s of PID.  Labs Reviewed  CERVICOVAGINAL ANCILLARY ONLY    Pending: Unresulted Labs (From admission, onward)    None        Will notify of any positive results. Instructed to refrain from sexual activity for at least seven days.  Reviewed expectations re: course of current medical issues. Questions answered. Outlined signs and symptoms indicating need for more acute intervention. Patient verbalized understanding. After Visit Summary given.   SUBJECTIVE:  Joyce Hernandez is a 25 y.o. female who thinks she has BV; h/o. Slight vag d/c over past two weeks; "smells like BV". Denies: urinary frequency, dysuria, and gross hematuria. Afebrile. No abdominal or pelvic pain. Normal PO intake wihout n/v. No genital rashes or lesions.   Patient's last menstrual period was 07/15/2021.   OBJECTIVE:  Vitals:   07/28/21 1706  BP: 122/79  Pulse: 85  Resp: 18  Temp: 98 F (36.7 C)  SpO2: 98%     General appearance: alert, cooperative, appears stated age and no distress Lungs: unlabored respirations; speaks full sentences without difficulty Back: no CVA tenderness; FROM at waist Abdomen: soft, non-tender GU: deferred Skin: warm and dry Psychological: alert and cooperative; normal mood and affect.    Labs Reviewed  CERVICOVAGINAL ANCILLARY ONLY    Allergies   Allergen Reactions   No Known Allergies     Past Medical History:  Diagnosis Date   Bacterial vaginosis    UTI (urinary tract infection)    Yeast infection    Family History  Problem Relation Age of Onset   Healthy Mother    Healthy Father    Social History   Socioeconomic History   Marital status: Single    Spouse name: Not on file   Number of children: Not on file   Years of education: Not on file   Highest education level: Not on file  Occupational History   Not on file  Tobacco Use   Smoking status: Never   Smokeless tobacco: Never  Vaping Use   Vaping Use: Never used  Substance and Sexual Activity   Alcohol use: Yes    Comment: occasionally   Drug use: No   Sexual activity: Yes    Partners: Male    Birth control/protection: None  Other Topics Concern   Not on file  Social History Narrative   Not on file   Social Determinants of Health   Financial Resource Strain: Not on file  Food Insecurity: Not on file  Transportation Needs: Not on file  Physical Activity: Not on file  Stress: Not on file  Social Connections: Not on file  Intimate Partner Violence: Not on file           Mardella Layman, MD 07/28/21 1757

## 2021-07-30 LAB — CERVICOVAGINAL ANCILLARY ONLY
Bacterial Vaginitis (gardnerella): POSITIVE — AB
Candida Glabrata: NEGATIVE
Candida Vaginitis: NEGATIVE
Chlamydia: NEGATIVE
Comment: NEGATIVE
Comment: NEGATIVE
Comment: NEGATIVE
Comment: NEGATIVE
Comment: NEGATIVE
Comment: NORMAL
Neisseria Gonorrhea: NEGATIVE
Trichomonas: NEGATIVE

## 2021-08-05 ENCOUNTER — Other Ambulatory Visit: Payer: Self-pay

## 2021-08-05 ENCOUNTER — Ambulatory Visit (INDEPENDENT_AMBULATORY_CARE_PROVIDER_SITE_OTHER): Payer: 59

## 2021-08-05 ENCOUNTER — Other Ambulatory Visit (HOSPITAL_COMMUNITY)
Admission: RE | Admit: 2021-08-05 | Discharge: 2021-08-05 | Disposition: A | Discharge status: Home / Self Care | Payer: 59 | Source: Ambulatory Visit

## 2021-08-05 VITALS — Wt 143.0 lb

## 2021-08-05 DIAGNOSIS — B9689 Other specified bacterial agents as the cause of diseases classified elsewhere: Secondary | ICD-10-CM | POA: Insufficient documentation

## 2021-08-05 DIAGNOSIS — Z01419 Encounter for gynecological examination (general) (routine) without abnormal findings: Secondary | ICD-10-CM | POA: Insufficient documentation

## 2021-08-05 DIAGNOSIS — N760 Acute vaginitis: Secondary | ICD-10-CM | POA: Diagnosis not present

## 2021-08-05 DIAGNOSIS — Z1239 Encounter for other screening for malignant neoplasm of breast: Secondary | ICD-10-CM

## 2021-08-05 DIAGNOSIS — Z8742 Personal history of other diseases of the female genital tract: Secondary | ICD-10-CM

## 2021-08-05 NOTE — Progress Notes (Signed)
GYNECOLOGY OFFICE VISIT NOTE-WELL WOMAN EXAM  History:   Braileigh Landenberger [id number] here today for recurrent bv.  She reports she has been treated, via the ER, multiple times this year.  She states she was told she needs to see her PCP regarding this issue.  However, today she is not having any symptoms.  She denies any abnormal vaginal discharge, odor, or itching. However, she reports she was diagnosed with BV about one week ago and did not pick up the treatment.  She denies pain or disocmfort during sex.  She does not use condoms and does not seek pregnancy.  She reports normal periods, but does not having two cycles last month.    Birth Control:  None  Reproductive Concerns Sexual Active: Yes Partners Type: Female Number of partners in last year: 2 STD Testing: Declines  Vaginal/GU Concerns: No pain or difficulty with urination.  She reports an increase in bowel movements, but denies  Breast Concerns/Exams: No breast concerns.  Checking infrequently, but would notice changes. Denies family history of breast, uterine, cervical, or ovarian cancer  Medical and Nutrition PCP: None Significant PMx: None Exercise: 3x week Weight Training & Cardio Tobacco/Drugs/Alcohol: Alcohol "occasionally... twice a month."  Denies other usage Nutrition: Endorses balanced intake  Licensed conveyancer at home: Endorses DV/A: None Social Support: Endorses Employment: [employer] Education: PA school at [employer]  Past Medical History:  Diagnosis Date   Bacterial vaginosis    UTI (urinary tract infection)    Yeast infection     Past Surgical History:  Procedure Laterality Date   BREAST SURGERY     lymph node removal   LYMPH NODE DISSECTION      The following portions of the patient's history were reviewed and updated as appropriate: allergies, current medications, past family history, past medical history, past social history, past surgical history and problem list.    Health Maintenance:  Abnormal pap and negative HRHPV on Oct 2020.  No mammogram d/t age.  Review of Systems:  Pertinent items noted in HPI and remainder of comprehensive ROS otherwise negative.    Objective:    Physical Exam Wt 143 lb (64.9 kg)   LMP 07/15/2021   BMI 24.55 kg/m  Physical Exam Constitutional:      General: She is not in acute distress.    Appearance: Normal appearance. She is not toxic-appearing.  Genitourinary:     Right Labia: No lesions.    Left Labia: No lesions.    Vaginal discharge (Small amt milky white mucoid discharge) present.     No vaginal bleeding.     Vaginal exam comments: CV collected.      Right Adnexa: not full.    Left Adnexa: not full.    No cervical motion tenderness, friability, polyp or nabothian cyst.     Cervical exam comments: Pap collected with brush and spatula..     Uterus is not enlarged.     Uterus is not absent. HENT:     Head: Normocephalic and atraumatic.  Eyes:     Conjunctiva/sclera: Conjunctivae normal.  Cardiovascular:     Rate and Rhythm: Normal rate and regular rhythm.     Heart sounds: Normal heart sounds.  Pulmonary:     Effort: Pulmonary effort is normal. No respiratory distress.     Breath sounds: Normal breath sounds.  Abdominal:     General: Bowel sounds are normal.     Palpations: Abdomen is soft.  Tenderness: There is no abdominal tenderness.  Musculoskeletal:     Cervical back: Normal range of motion.  Neurological:     Mental Status: She is alert and oriented to person, place, and time.  Skin:    General: Skin is warm and dry.     Comments: Tattoos noted  Psychiatric:        Mood and Affect: Mood normal.        Behavior: Behavior normal.        Thought Content: Thought content normal.  Vitals reviewed.     Labs and Imaging No results found for this or any previous visit (from the past 168 hour(s)). No results found.   Assessment & Plan:  25 year old Female Well Woman Exam with  Pap Clinical Breast Exam  History of LGSIL w/o Follow Up Recurrent BV-No Recent Treatment  1. Well woman exam with routine gynecological exam -Exam performed and findings discussed. -Encouraged to activate and utilize Mychart for reviewing of results and communication with office.  -Encouraged to continue physical activity at least 5x/week.  2. Hx of abnormal cervical Pap smear -Educated on ASCCP guidelines regarding pap smear evaluation and frequency. -Informed of turnover time and provider/clinic policy on releasing results. -Informed of last pap that was LGSIL. -Discussed follow up that was suppose to occur last year. -Will repeat pap today. -Will manage accordingly  3. Bacterial vaginosis -CV collected. -Informed that if abnormal will treat findings with metrogel f/b boric acid regimen. -Patient agreeable to having boric acid sent to GCP and paying out of pocket cost of at least $30.   4. Encounter for screening breast examination -Educated and encouraged to initiate monthly SBE, after menstrual cycles, with increased breast awareness including examination of breast for skin changes, moles, tenderness, etc.   Routine preventative health maintenance measures emphasized. Please refer to After Visit Summary for other counseling recommendations.   No follow-ups on file.      Cherre Robins, CNM 08/05/2021

## 2021-08-05 NOTE — Progress Notes (Signed)
Pt complains of vaginal discharge for about six month.  Last pap 2020- abnormal cells/ Low-grade

## 2021-08-06 LAB — CERVICOVAGINAL ANCILLARY ONLY
Bacterial Vaginitis (gardnerella): NEGATIVE
Chlamydia: NEGATIVE
Comment: NEGATIVE
Comment: NEGATIVE
Comment: NORMAL
Neisseria Gonorrhea: NEGATIVE

## 2021-08-10 LAB — CYTOLOGY - PAP
Adequacy: ABSENT
Comment: NEGATIVE
Diagnosis: NEGATIVE
High risk HPV: NEGATIVE

## 2021-09-08 ENCOUNTER — Ambulatory Visit (HOSPITAL_COMMUNITY)
Admission: EM | Admit: 2021-09-08 | Discharge: 2021-09-08 | Disposition: A | Discharge status: Home / Self Care | Payer: 59 | Attending: Family Medicine | Admitting: Family Medicine

## 2021-09-08 ENCOUNTER — Encounter (HOSPITAL_COMMUNITY): Payer: Self-pay | Admitting: Emergency Medicine

## 2021-09-08 ENCOUNTER — Other Ambulatory Visit: Payer: Self-pay

## 2021-09-08 DIAGNOSIS — N760 Acute vaginitis: Secondary | ICD-10-CM | POA: Diagnosis not present

## 2021-09-08 DIAGNOSIS — R12 Heartburn: Secondary | ICD-10-CM | POA: Diagnosis not present

## 2021-09-08 LAB — POCT URINALYSIS DIPSTICK, ED / UC
Bilirubin Urine: NEGATIVE
Glucose, UA: NEGATIVE mg/dL
Hgb urine dipstick: NEGATIVE
Ketones, ur: 15 mg/dL — AB
Leukocytes,Ua: NEGATIVE
Nitrite: NEGATIVE
Protein, ur: NEGATIVE mg/dL
Specific Gravity, Urine: 1.025 (ref 1.005–1.030)
Urobilinogen, UA: 0.2 mg/dL (ref 0.0–1.0)
pH: 6 (ref 5.0–8.0)

## 2021-09-08 LAB — POC URINE PREG, ED: Preg Test, Ur: NEGATIVE

## 2021-09-08 MED ORDER — METRONIDAZOLE 0.75 % VA GEL: 1.0000 | Freq: Every day | VAGINAL | 0 refills | Status: AC

## 2021-09-08 NOTE — ED Notes (Signed)
No answer

## 2021-09-08 NOTE — Discharge Instructions (Addendum)
Start metrogel once nightly x 5 nights. Will call if test results are positive and treat based on results. Avoid intercourse until test results obtained. Try OTC omeprazole or famotidine for your heart burn like symptoms. RTC of f/u with PCP for any new or worsening symptoms.

## 2021-09-08 NOTE — ED Triage Notes (Signed)
Patient c/o ABD pain and nausea x 3 days.   Patient endorses left upper and lower quadrant ABD pain.   Patient endorses vaginal irritation. Patient endorses foul vaginal discharge odor.   Patient wants STD testing.   Patient hasn't taken any medications for symptoms.

## 2021-09-08 NOTE — ED Provider Notes (Signed)
[place]    CSN: [id number] Arrival date & time: 09/08/21  1604      History   Chief Complaint Chief Complaint  Patient presents with   Abdominal Pain   Nausea    HPI Joyce Hernandez is a 25 y.o. female.   [place] 25 year old female presents today with a 3-day history of nausea.  She initially stated left lower and upper abdominal pain, but states it is actually more suprapubic associated with some vaginal discharge.  She also endorses mild heartburn symptoms.  Patient does have a history of bacterial vaginosis and yeast.  She states she is monogamous with 1 partner, but "you cannot trust that these days".  She denies using protection.  Her last menstrual period was 2 weeks ago, she has not tried any over-the-counter medications for symptoms.  She denies any fever, nausea, vomiting, diarrhea.  She denies any dysuria or urgency or frequency.  She states as of this morning her vaginal discharge started smelling "foul".  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 Authorizing Provider  metroNIDAZOLE (METROGEL) 0.75 % vaginal gel Place 1 Applicatorful  vaginally at bedtime for 5 days. 09/08/21 09/13/21 Yes Rushil Kimbrell L, PA  ibuprofen (ADVIL) 800 MG tablet Take 1 tablet (800 mg total) by mouth 3 (three) times daily. Patient not taking: Reported on 08/05/2021 09/29/19   Wieters, Madelynn Done C, PA-C  Cetirizine HCl 10 MG CAPS Take 1 capsule (10 mg total) by mouth daily. 09/29/19 01/27/20  Wieters, Hallie C, PA-C  fluticasone (FLONASE) 50 MCG/ACT nasal spray Place 1-2 sprays into both nostrils daily for 7 days. 09/29/19 01/27/20  Wieters, Hallie C, PA-C  Levonorgestrel-Ethinyl Estradiol (AMETHIA) 0.15-0.03 &0.01 MG tablet Take 1 tablet by mouth daily. Patient not taking: Reported on 08/29/2019 07/25/19 09/29/19  Gavin Pound, CNM  omeprazole (PRILOSEC) 20 MG capsule Take 1 capsule (20 mg total) by mouth daily. Patient not taking: Reported on 07/25/2019 03/30/18 07/31/19  Raylene Everts, MD    Family History Family History  Problem Relation Age of Onset   Healthy Mother    Healthy Father     Social History Social History   Tobacco Use   Smoking status: Never   Smokeless tobacco: Never  Vaping Use   Vaping Use: Never used  Substance Use Topics   Alcohol use: Yes    Comment: occasionally  Drug use: No     Allergies   No known allergies   Review of Systems Review of Systems  Constitutional: Negative.   HENT: Negative.    Eyes: Negative.   Respiratory: Negative.    Cardiovascular: Negative.   Gastrointestinal:  Positive for abdominal pain (mild heartburn). Negative for abdominal distention, anal bleeding, constipation, diarrhea, nausea and vomiting.  Genitourinary:  Positive for pelvic pain (mild suprapubic) and vaginal discharge. Negative for decreased urine volume, difficulty urinating, dyspareunia, dysuria, enuresis, flank pain, frequency, genital sores, hematuria, menstrual problem, urgency, vaginal bleeding and vaginal pain.  Musculoskeletal: Negative.   Skin: Negative.   All other systems reviewed and are  negative.   Physical Exam Triage Vital Signs ED Triage Vitals  Enc Vitals Group     BP 09/08/21 1831 125/81     Pulse Rate 09/08/21 1831 65     Resp 09/08/21 1831 12     Temp 09/08/21 1831 99 F (37.2 C)     Temp Source 09/08/21 1831 Oral     SpO2 09/08/21 1831 98 %     Weight --      Height --      Head Circumference --      Peak Flow --      Pain Score 09/08/21 1833 2     Pain Loc --      Pain Edu? --      Excl. in GC? --    No data found.  Updated Vital Signs BP 125/81 (BP Location: Right Arm)    Pulse 65    Temp 99 F (37.2 C) (Oral)    Resp 12    LMP 09/03/2021 (Exact Date)    SpO2 98%   Visual Acuity Right Eye Distance:   Left Eye Distance:   Bilateral Distance:    Right Eye Near:   Left Eye Near:    Bilateral Near:     Physical Exam Vitals and nursing note reviewed. Chaperone present: chaperone deferred.  Constitutional:      Appearance: She is well-developed and normal weight.  HENT:     Head: Normocephalic.  Cardiovascular:     Rate and Rhythm: Normal rate.     Heart sounds: No murmur heard. Pulmonary:     Effort: Pulmonary effort is normal. No respiratory distress.     Breath sounds: No wheezing.  Abdominal:     General: Abdomen is flat. Bowel sounds are normal. There is no distension. There are no signs of injury.     Palpations: Abdomen is soft. There is no shifting dullness, fluid wave, hepatomegaly, splenomegaly, mass or pulsatile mass.     Tenderness: There is abdominal tenderness (MINIMAL even to deep palpation) in the suprapubic area. There is no right CVA tenderness, left CVA tenderness, guarding or rebound. Negative signs include Murphy's sign, Rovsing's sign, McBurney's sign, psoas sign and obturator sign.     Hernia: No hernia is present.  Genitourinary:    General: Normal vulva.     Pubic Area: No rash or pubic lice.      Labia:        Right: No rash, tenderness, lesion or injury.        Left: No rash, tenderness, lesion or injury.       Urethra: No prolapse, urethral pain, urethral swelling or urethral lesion.     Vagina: No signs of injury and foreign body. Vaginal discharge present. No erythema, tenderness, bleeding, lesions or prolapsed vaginal walls.     Cervix:  No cervical motion tenderness, discharge, friability, lesion, erythema or eversion.     Uterus: Normal. Not deviated, not enlarged, not fixed, not tender and no uterine prolapse.      Adnexa: Right adnexa normal and left adnexa normal.       Right: No mass, tenderness or fullness.         Left: No mass, tenderness or fullness.       Rectum: Normal.  Neurological:     Mental Status: She is alert.     UC Treatments / Results  Labs (all labs ordered are listed, but only abnormal results are displayed) Labs Reviewed  POCT URINALYSIS DIPSTICK, ED / UC - Abnormal; Notable for the following components:      Result Value   Ketones, ur 15 (*)    All other components within normal limits  POC URINE PREG, ED  CERVICOVAGINAL ANCILLARY ONLY    EKG   Radiology No results found.  Procedures Procedures (including critical care time)  Medications Ordered in UC Medications - No data to display  Initial Impression / Assessment and Plan / UC Course  I have reviewed the triage vital signs and the nursing notes.  Pertinent labs & imaging results that were available during my care of the patient were reviewed by me and considered in my medical decision making (see chart for details).     Vaginitis -patient with known history of BV, clinical appearance looks consistent.  No signs of PID on exam.  Given the upcoming holidays she is wanting to drink alcohol.  Therefore we will try MetroGel vaginally once per day x5 days. Heartburn -may try over-the-counter omeprazole or famotidine.  Follow-up with PCP if symptoms persist  Final Clinical Impressions(s) / UC Diagnoses   Final diagnoses:  Vaginitis and vulvovaginitis  Heartburn     Discharge Instructions       Start metrogel once nightly x 5 nights. Will call if test results are positive and treat based on results. Avoid intercourse until test results obtained. Try OTC omeprazole or famotidine for your heart burn like symptoms. RTC of f/u with PCP for any new or worsening symptoms.   ED Prescriptions     Medication Sig Dispense Auth. Provider   metroNIDAZOLE (METROGEL) 0.75 % vaginal gel Place 1 Applicatorful vaginally at bedtime for 5 days. 70 g Samone Guhl L, [place]      PDMP not reviewed this encounter.   Chaney Malling, [place] 09/10/21 [phone]

## 2021-09-09 LAB — CERVICOVAGINAL ANCILLARY ONLY
Bacterial Vaginitis (gardnerella): POSITIVE — AB
Candida Glabrata: NEGATIVE
Candida Vaginitis: NEGATIVE
Chlamydia: NEGATIVE
Comment: NEGATIVE
Comment: NEGATIVE
Comment: NEGATIVE
Comment: NEGATIVE
Comment: NEGATIVE
Comment: NORMAL
Neisseria Gonorrhea: NEGATIVE
Trichomonas: NEGATIVE

## 2021-12-28 ENCOUNTER — Ambulatory Visit: Payer: 59

## 2021-12-29 ENCOUNTER — Other Ambulatory Visit (HOSPITAL_COMMUNITY)
Admission: RE | Admit: 2021-12-29 | Discharge: 2021-12-29 | Disposition: A | Discharge status: Home / Self Care | Payer: 59 | Source: Ambulatory Visit | Attending: Obstetrics and Gynecology | Admitting: Obstetrics and Gynecology

## 2021-12-29 ENCOUNTER — Ambulatory Visit (INDEPENDENT_AMBULATORY_CARE_PROVIDER_SITE_OTHER): Payer: Medicaid Other | Admitting: Emergency Medicine

## 2021-12-29 VITALS — BP 118/80 | HR 94 | Wt 143.0 lb

## 2021-12-29 DIAGNOSIS — N898 Other specified noninflammatory disorders of vagina: Secondary | ICD-10-CM | POA: Diagnosis not present

## 2021-12-29 LAB — POCT URINALYSIS DIPSTICK
Bilirubin, UA: NEGATIVE
Glucose, UA: NEGATIVE
Ketones, UA: NEGATIVE
Leukocytes, UA: NEGATIVE
Nitrite, UA: NEGATIVE
Protein, UA: NEGATIVE
Spec Grav, UA: 1.01 (ref 1.010–1.025)
Urobilinogen, UA: 0.2 E.U./dL
pH, UA: 6.5 (ref 5.0–8.0)

## 2021-12-29 NOTE — Progress Notes (Signed)
SUBJECTIVE:  ?26 y.o. female complains of white, curd-like, and malodorous vaginal discharge for 2 week(s). ?Denies abnormal vaginal bleeding or significant pelvic pain or ?fever. No UTI symptoms. Denies history of known exposure to STD. ?Does endorse lower abdominal pain.  ? ?No LMP recorded. ? ?OBJECTIVE:  ?She appears well, afebrile. ?Urine dipstick: negative for all components. ? ?ASSESSMENT:  ?Vaginal Discharge - thick, chunky ?Vaginal Odor- present ? ?Pt states she is interested in birth control but does not want any LARCs or anything hormonal. Discussed Phexxi and sample/pamphlet given.  ? ?PLAN:  ?GC, chlamydia, trichomonas, BVAG, CVAG probe sent to lab. ?Treatment: To be determined once lab results are received ?ROV prn if symptoms persist or worsen.  ?

## 2021-12-30 LAB — CERVICOVAGINAL ANCILLARY ONLY
Bacterial Vaginitis (gardnerella): POSITIVE — AB
Candida Glabrata: NEGATIVE
Candida Vaginitis: NEGATIVE
Chlamydia: NEGATIVE
Comment: NEGATIVE
Comment: NEGATIVE
Comment: NEGATIVE
Comment: NEGATIVE
Comment: NEGATIVE
Comment: NORMAL
Neisseria Gonorrhea: NEGATIVE
Trichomonas: NEGATIVE

## 2022-01-05 ENCOUNTER — Ambulatory Visit (HOSPITAL_COMMUNITY)
Admission: EM | Admit: 2022-01-05 | Discharge: 2022-01-05 | Disposition: A | Discharge status: Home / Self Care | Payer: 59 | Attending: Family Medicine | Admitting: Family Medicine

## 2022-01-05 ENCOUNTER — Encounter (HOSPITAL_COMMUNITY): Payer: Self-pay | Admitting: Emergency Medicine

## 2022-01-05 ENCOUNTER — Ambulatory Visit (INDEPENDENT_AMBULATORY_CARE_PROVIDER_SITE_OTHER): Payer: 59

## 2022-01-05 DIAGNOSIS — R109 Unspecified abdominal pain: Secondary | ICD-10-CM | POA: Diagnosis not present

## 2022-01-05 DIAGNOSIS — R1031 Right lower quadrant pain: Secondary | ICD-10-CM

## 2022-01-05 DIAGNOSIS — K625 Hemorrhage of anus and rectum: Secondary | ICD-10-CM | POA: Insufficient documentation

## 2022-01-05 DIAGNOSIS — R350 Frequency of micturition: Secondary | ICD-10-CM | POA: Insufficient documentation

## 2022-01-05 DIAGNOSIS — K5900 Constipation, unspecified: Secondary | ICD-10-CM | POA: Diagnosis present

## 2022-01-05 LAB — CBC WITH DIFFERENTIAL/PLATELET
Abs Immature Granulocytes: 0.03 10*3/uL (ref 0.00–0.07)
Basophils Absolute: 0.1 10*3/uL (ref 0.0–0.1)
Basophils Relative: 1 %
Eosinophils Absolute: 0.1 10*3/uL (ref 0.0–0.5)
Eosinophils Relative: 1 %
HCT: 41.5 % (ref 36.0–46.0)
Hemoglobin: 13.6 g/dL (ref 12.0–15.0)
Immature Granulocytes: 0 %
Lymphocytes Relative: 46 %
Lymphs Abs: 4.2 10*3/uL — ABNORMAL HIGH (ref 0.7–4.0)
MCH: 27.1 pg (ref 26.0–34.0)
MCHC: 32.8 g/dL (ref 30.0–36.0)
MCV: 82.7 fL (ref 80.0–100.0)
Monocytes Absolute: 1 10*3/uL (ref 0.1–1.0)
Monocytes Relative: 11 %
Neutro Abs: 3.8 10*3/uL (ref 1.7–7.7)
Neutrophils Relative %: 41 %
Platelets: 306 10*3/uL (ref 150–400)
RBC: 5.02 MIL/uL (ref 3.87–5.11)
RDW: 13.7 % (ref 11.5–15.5)
WBC: 9.1 10*3/uL (ref 4.0–10.5)
nRBC: 0 % (ref 0.0–0.2)

## 2022-01-05 LAB — COMPREHENSIVE METABOLIC PANEL
ALT: 14 U/L (ref 0–44)
AST: 18 U/L (ref 15–41)
Albumin: 4.1 g/dL (ref 3.5–5.0)
Alkaline Phosphatase: 47 U/L (ref 38–126)
Anion gap: 6 (ref 5–15)
BUN: 12 mg/dL (ref 6–20)
CO2: 27 mmol/L (ref 22–32)
Calcium: 9.4 mg/dL (ref 8.9–10.3)
Chloride: 105 mmol/L (ref 98–111)
Creatinine, Ser: 0.88 mg/dL (ref 0.44–1.00)
GFR, Estimated: >60 mL/min (ref >60)
Glucose, Bld: 90 mg/dL (ref 70–99)
Potassium: 4 mmol/L (ref 3.5–5.1)
Sodium: 138 mmol/L (ref 135–145)
Total Bilirubin: 0.5 mg/dL (ref 0.3–1.2)
Total Protein: 7.8 g/dL (ref 6.5–8.1)

## 2022-01-05 LAB — POCT URINALYSIS DIPSTICK, ED / UC
Bilirubin Urine: NEGATIVE
Glucose, UA: NEGATIVE mg/dL
Hgb urine dipstick: NEGATIVE
Ketones, ur: NEGATIVE mg/dL
Nitrite: NEGATIVE
Protein, ur: NEGATIVE mg/dL
Specific Gravity, Urine: 1.025 (ref 1.005–1.030)
Urobilinogen, UA: 0.2 mg/dL (ref 0.0–1.0)
pH: 5.5 (ref 5.0–8.0)

## 2022-01-05 NOTE — ED Triage Notes (Addendum)
Pt is present  with lower right abdominal pain and bloody stools. Pt describes the pain as a burning sensation , denies any vomiting but states she does have on occasional nausea. Pt states sx started x2 weeks ago.  ?

## 2022-01-05 NOTE — Discharge Instructions (Addendum)
You were seen today for abdominal pain and blood in your stool.  ?Your xray shows a large amount of stool in the right lower colon, as well as throughout the colon.  ?I recommend you start over the counter mirilax daily, as well as a stool softener to help with bowel movements.  ?Your blood work will be resulted later today and will be available via mychart.  We will call you if there is anything abnormal.  ?Your urine looks okay, but will culture.  ?If you have worsening pain, or increased blood in your stool then please go to the ER for evaluation, or follow up with your primary care provider for further care.  ?

## 2022-01-05 NOTE — ED Provider Notes (Signed)
?[facility] ? ? ? ?CSN: [id number] ?Arrival date & time: 01/05/22  0802 ? ? ?  ? ?History   ?Chief Complaint ?Chief Complaint  ?Patient presents with  ? Rectal Bleeding  ? Abdominal Pain  ? ? ?HPI ?Joyce Hernandez is a 26 y.o. female.  ? ?She has been having months of "fuzzy" looking stool.  Several weeks ago she noted blood while wiping only.  This weekend she noted more blood on the tissue, and some in the toilet, but not a lot.  ?She is having burning sensation at the RLQ, and into the right lower back/flank.  That also has been going on the last several weeks.  Not changing.  3/10 in intensity.  ?+ nausea, no vomiting.  She has been eating/drinking okay.  ?No diarrhea or constipation.  ?No fevers/chills.  ?She still has her appendix.  ?No known FH of GI issues.  ?She does have gerd, no other issues.  ?No new meds recently.  ?Some urinary burning, frequency.  ?Full STD testing done done, normal except for BV.  Negative UA 1 week ago.  ? ?Past Medical History:  ?Diagnosis Date  ? Bacterial vaginosis   ? UTI (urinary tract infection)   ? Yeast infection   ? ? ?Patient Active Problem List  ? Diagnosis Date Noted  ? 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 ? ?Past Surgical History:  ?Procedure Laterality Date  ? BREAST SURGERY    ? lymph node removal  ? LYMPH NODE DISSECTION    ? ? ?OB History   ? ? Gravida  ?0  ? Para  ?0  ? Term  ?0  ? Preterm  ?0  ? AB  ?0  ? Living  ?0  ?  ? ? SAB  ?0  ? IAB  ?0  ? Ectopic  ?0  ? Multiple  ?0  ? Live Births  ?0  ?   ?  ?  ? ? ? ?Home Medications   ? ?Prior to Admission medications   ?Medication Sig Start Date End Date Taking? Authorizing Provider  ?ibuprofen (ADVIL) 800 MG tablet Take 1 tablet (800 mg total) by mouth 3 (three) times daily. ?Patient not taking: Reported on 08/05/2021 09/29/19   Lew DawesWieters, Hallie C, PA-C  ?Multiple Vitamin (MULTIVITAMIN) capsule Take 1 capsule by mouth daily.    [provider]   ?Cetirizine HCl 10 MG CAPS Take 1 capsule (10 mg total) by mouth daily. 09/29/19 01/27/20  Wieters, Hallie C, PA-C  ?fluticasone (FLONASE) 50 MCG/ACT nasal spray Place 1-2 sprays into both nostrils daily for 7 days. 09/29/19 01/27/20  Wieters, Junius CreamerHallie C, PA-C  ?Levonorgestrel-Ethinyl Estradiol (AMETHIA) 0.15-0.03 &0.01 MG tablet Take 1 tablet by mouth daily. ?Patient not taking: Reported on 08/29/2019 07/25/19 09/29/19  Gerrit HeckEmly, Jessica, CNM  ?omeprazole (PRILOSEC) 20 MG capsule Take 1 capsule (20 mg total) by mouth daily. ?Patient not taking: Reported on 07/25/2019 03/30/18 07/31/19  Eustace MooreNelson, Yvonne Sue, MD  ? ? ?Family History ?Family History  ?Problem Relation Age of Onset  ? Healthy Mother   ? Healthy Father   ? ? ?Social History ?Social History  ? ?Tobacco Use  ? Smoking status: Never  ? Smokeless tobacco: Never  ?Vaping Use  ? Vaping Use: Never used  ?Substance Use Topics  ? Alcohol use: Yes  ?  Comment: occasionally  ? Drug use: No  ? ? ? ?Allergies   ?No known allergies ? ? ?Review of Systems ?  Review of Systems  ?Constitutional:  Negative for chills and fever.  ?HENT: Negative.    ?Respiratory: Negative.    ?Cardiovascular: Negative.   ?Gastrointestinal:  Positive for abdominal pain, blood in stool and nausea. Negative for constipation, diarrhea and vomiting.  ?Endocrine: Negative.   ?Genitourinary:  Positive for frequency.  ?Musculoskeletal: Negative.   ?Skin: Negative.   ? ? ?Physical Exam ?Triage Vital Signs ?ED Triage Vitals  ?Enc Vitals Group  ?   BP 01/05/22 0821 118/74  ?   Pulse Rate 01/05/22 0821 76  ?   Resp 01/05/22 0821 17  ?   Temp 01/05/22 0821 (!) 97.4 ?F (36.3 ?C)  ?   Temp src --   ?   SpO2 01/05/22 0821 98 %  ?   Weight --   ?   Height --   ?   Head Circumference --   ?   Peak Flow --   ?   Pain Score 01/05/22 0819 4  ?   Pain Loc --   ?   Pain Edu? --   ?   Excl. in GC? --   ? ?No data found. ? ?Updated Vital Signs ?BP 118/74   Pulse 76   Temp (!) 97.4 ?F (36.3 ?C)   Resp 17   LMP 12/17/2021    SpO2 98%  ? ?Visual Acuity ?Right Eye Distance:   ?Left Eye Distance:   ?Bilateral Distance:   ? ?Right Eye Near:   ?Left Eye Near:    ?Bilateral Near:    ? ?Physical Exam ?Constitutional:   ?   General: She is not in acute distress. ?   Appearance: She is well-developed. She is not ill-appearing.  ?HENT:  ?   Head: Normocephalic.  ?Cardiovascular:  ?   Rate and Rhythm: Normal rate and regular rhythm.  ?Pulmonary:  ?   Effort: Pulmonary effort is normal.  ?   Breath sounds: Normal breath sounds.  ?Abdominal:  ?   General: Abdomen is flat. Bowel sounds are normal. There is no distension.  ?   Palpations: Abdomen is soft.  ?   Tenderness: There is abdominal tenderness in the suprapubic area.  ?   Comments: No TTP to the RUQ or RLQ on exam  ?Skin: ?   General: Skin is warm.  ?Neurological:  ?   General: No focal deficit present.  ?   Mental Status: She is alert.  ?Psychiatric:     ?   Mood and Affect: Mood normal.  ? ? ? ?UC Treatments / Results  ?Labs ?(all labs ordered are listed, but only abnormal results are displayed) ?Labs Reviewed  ?POCT URINALYSIS DIPSTICK, ED / UC - Abnormal; Notable for the following components:  ?    Result Value  ? Leukocytes,Ua TRACE (*)   ? All other components within normal limits  ?URINE CULTURE  ?COMPREHENSIVE METABOLIC PANEL  ?CBC WITH DIFFERENTIAL/PLATELET  ? ? ?EKG ? ? ?Radiology ?DG Abd 1 View ? ?Result Date: 01/05/2022 ?CLINICAL DATA:  A 26 year old female presents with history of RIGHT lower quadrant abdominal pain and bloody stools. EXAM: ABDOMEN - 1 VIEW COMPARISON:  None FINDINGS: Image excludes the RIGHT and LEFT hemidiaphragm. No signs of small bowel dilation to suggest bowel obstruction. Large volume stool in the RIGHT hemicolon, ascending colon and moderate stool in the transverse and proximal descending colon. Gas seen to the level of the distal sigmoid/rectum. On limited assessment there is no acute skeletal finding. Phleboliths are present in the  pelvis. Renal contours  are obscured by large volume of stool. Soft tissues are grossly unremarkable. IMPRESSION: Large volume stool in the RIGHT hemicolon, ascending colon and moderate stool in the transverse and proximal descending colon. No signs of bowel obstruction. Query constipation. Electronically Signed   By: Donzetta Kohut M.D.   On: 01/05/2022 09:16   ? ?Procedures ?Procedures (including critical care time) ? ?Medications Ordered in UC ?Medications - No data to display ? ?Initial Impression / Assessment and Plan / UC Course  ?I have reviewed the triage vital signs and the nursing notes. ? ?Pertinent labs & imaging results that were available during my care of the patient were reviewed by me and considered in my medical decision making (see chart for details). ? ?  ?Final Clinical Impressions(s) / UC Diagnoses  ? ?Final diagnoses:  ?Abdominal pain, unspecified abdominal location  ?Urinary frequency  ?Rectal bleeding  ?Constipation, unspecified constipation type  ? ? ? ?Discharge Instructions   ? ?  ?You were seen today for abdominal pain and blood in your stool.  ?Your xray shows a large amount of stool in the right lower colon, as well as throughout the colon.  ?I recommend you start over the counter mirilax daily, as well as a stool softener to help with bowel movements.  ?Your blood work will be resulted later today and will be available via mychart.  We will call you if there is anything abnormal.  ?Your urine looks okay, but will culture.  ?If you have worsening pain, or increased blood in your stool then please go to the ER for evaluation, or follow up with your primary care provider for further care.  ? ? ? ?ED Prescriptions   ?None ?  ? ?PDMP not reviewed this encounter. ?  Jannifer Franklin, MD ?01/05/22 [phone] ? ?

## 2022-01-07 LAB — URINE CULTURE: Culture: 40000 — AB

## 2022-01-10 ENCOUNTER — Other Ambulatory Visit: Payer: Self-pay | Admitting: Certified Nurse Midwife

## 2022-01-10 DIAGNOSIS — B9689 Other specified bacterial agents as the cause of diseases classified elsewhere: Secondary | ICD-10-CM

## 2022-01-10 MED ORDER — METRONIDAZOLE 0.75 % VA GEL: 1.0000 | Freq: Every day | VAGINAL | 1 refills | Status: DC

## 2022-02-11 ENCOUNTER — Encounter (HOSPITAL_COMMUNITY): Payer: Self-pay

## 2022-02-11 ENCOUNTER — Ambulatory Visit (HOSPITAL_COMMUNITY)
Admission: EM | Admit: 2022-02-11 | Discharge: 2022-02-11 | Disposition: A | Discharge status: Home / Self Care | Payer: 59 | Attending: Emergency Medicine | Admitting: Emergency Medicine

## 2022-02-11 DIAGNOSIS — N760 Acute vaginitis: Secondary | ICD-10-CM | POA: Insufficient documentation

## 2022-02-11 DIAGNOSIS — B9689 Other specified bacterial agents as the cause of diseases classified elsewhere: Secondary | ICD-10-CM | POA: Insufficient documentation

## 2022-02-11 DIAGNOSIS — N898 Other specified noninflammatory disorders of vagina: Secondary | ICD-10-CM | POA: Insufficient documentation

## 2022-02-11 DIAGNOSIS — R300 Dysuria: Secondary | ICD-10-CM | POA: Insufficient documentation

## 2022-02-11 LAB — POCT URINALYSIS DIPSTICK, ED / UC
Glucose, UA: NEGATIVE mg/dL
Hgb urine dipstick: NEGATIVE
Ketones, ur: 40 mg/dL — AB
Leukocytes,Ua: NEGATIVE
Nitrite: NEGATIVE
Protein, ur: NEGATIVE mg/dL
Specific Gravity, Urine: >=1.03 (ref 1.005–1.030)
Urobilinogen, UA: 0.2 mg/dL (ref 0.0–1.0)
pH: 5.5 (ref 5.0–8.0)

## 2022-02-11 MED ORDER — METRONIDAZOLE 0.75 % VA GEL: 1.0000 | Freq: Every day | VAGINAL | 0 refills | Status: AC

## 2022-02-11 MED ORDER — METRONIDAZOLE 0.75 % VA GEL: 1.0000 | Freq: Every day | VAGINAL | 0 refills | Status: DC

## 2022-02-11 NOTE — ED Triage Notes (Signed)
Onset 2-3 days with dysuria and vaginal itching. Pt would like a STI testing.

## 2022-02-11 NOTE — ED Provider Notes (Signed)
[facility]    CSN: [id number] Arrival date & time: 02/11/22  0801      History   Chief Complaint Chief Complaint  Patient presents with   Vaginal Discharge   Dysuria    HPI Joyce Hernandez is a 26 y.o. female.   Patient presents with dysuria and vaginal itching for 2 days.  Has not attempted treatment of symptoms.  Sexually active, 2 partners in the last 3 months, no condom use.  No known exposure.  Currently menstruating.  History of recurrent bacterial vaginosis.  Denies urinary frequency, urgency, hematuria, abdominal pain or pressure, flank pain, fever or chills.     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 Authorizing Provider  metroNIDAZOLE (METROGEL) 0.75 % vaginal gel Place 1 Applicatorful vaginally at bedtime. Apply one applicatorful to vagina at bedtime for 5 days 01/10/22   Bernerd Limbo, CNM  Multiple Vitamin (MULTIVITAMIN) capsule Take 1 capsule by mouth daily.    [provider]  Cetirizine HCl 10 MG CAPS Take 1 capsule (10 mg total) by mouth daily. 09/29/19 01/27/20  Wieters, Hallie C, PA-C  fluticasone (FLONASE) 50 MCG/ACT nasal spray Place 1-2 sprays into both nostrils daily for 7 days. 09/29/19 01/27/20  Wieters, Hallie C, PA-C  Levonorgestrel-Ethinyl Estradiol (AMETHIA) 0.15-0.03 &0.01 MG tablet Take 1 tablet by mouth  daily. Patient not taking: Reported on 08/29/2019 07/25/19 09/29/19  Gerrit Heck, CNM  omeprazole (PRILOSEC) 20 MG capsule Take 1 capsule (20 mg total) by mouth daily. Patient not taking: Reported on 07/25/2019 03/30/18 07/31/19  Eustace Moore, MD    Family History Family History  Problem Relation Age of Onset   Healthy Mother    Healthy Father     Social History Social History   Tobacco Use   Smoking status: Never   Smokeless tobacco: Never  Vaping Use   Vaping Use: Never used  Substance Use Topics   Alcohol use: Yes    Comment: occasionally   Drug use: No     Allergies   No known allergies   Review of Systems Review of Systems  Constitutional: Negative.   Respiratory: Negative.    Cardiovascular: Negative.   Genitourinary:  Positive for dysuria and vaginal discharge. Negative for decreased urine volume, difficulty urinating, dyspareunia, enuresis, flank pain, frequency, genital sores, hematuria, menstrual problem, pelvic pain, urgency, vaginal bleeding and vaginal pain.  Skin: Negative.     Physical Exam Triage Vital Signs ED Triage Vitals [02/11/22 0819]  Enc Vitals Group     BP 132/86  Pulse Rate 70     Resp 16     Temp 98.3 F (36.8 C)     Temp Source Oral     SpO2 98 %     Weight      Height      Head Circumference      Peak Flow      Pain Score      Pain Loc      Pain Edu?      Excl. in GC?    No data found.  Updated Vital Signs BP 132/86 (BP Location: Left Arm)   Pulse 70   Temp 98.3 F (36.8 C) (Oral)   Resp 16   SpO2 98%   Visual Acuity Right Eye Distance:   Left Eye Distance:   Bilateral Distance:    Right Eye Near:   Left Eye Near:    Bilateral Near:     Physical Exam Constitutional:      Appearance: Normal appearance.  HENT:     Head: Normocephalic.  Eyes:     Extraocular Movements: Extraocular movements intact.  Pulmonary:     Effort: Pulmonary effort is normal.  Genitourinary:    Comments:  Deferred Neurological:     Mental Status: She is alert and oriented to person, place, and time. Mental status is at baseline.  Psychiatric:        Mood and Affect: Mood normal.        Behavior: Behavior normal.     UC Treatments / Results  Labs (all labs ordered are listed, but only abnormal results are displayed) Labs Reviewed  CERVICOVAGINAL ANCILLARY ONLY    EKG   Radiology No results found.  Procedures Procedures (including critical care time)  Medications Ordered in UC Medications - No data to display  Initial Impression / Assessment and Plan / UC Course  I have reviewed the triage vital signs and the nursing notes.  Pertinent labs & imaging results that were available during my care of the patient were reviewed by me and considered in my medical decision making (see chart for details).  Dysuria Vaginal itching  We will prophylactically treat for bacterial vaginosis based on history, MetroGel 7-day course prescribed, STI labs pending, will treat per protocol, advised abstinence until lab results, treatment is complete and all symptoms have resolved, may follow-up with urgent care as needed for recurrent or persisting symptoms, advised condom use during all sexual encounters moving forward Final Clinical Impressions(s) / UC Diagnoses   Final diagnoses:  None   Discharge Instructions   None    ED Prescriptions   None    PDMP not reviewed this encounter.   Valinda Hoar, [place] 02/11/22 [phone]

## 2022-02-11 NOTE — Discharge Instructions (Addendum)
Today you are being treated prophylactically for  Bacterial vaginosis   Take Metronidazole before bed for 7 days   Bacterial vaginosis which results from an overgrowth of one on several organisms that are normally present in your vagina. Vaginosis is an inflammation of the vagina that can result in discharge, itching and pain.  Labs pending 2-3 days, you will be contacted if positive for any sti and treatment will be sent to the pharmacy, you will have to return to the clinic if positive for gonorrhea to receive treatment   Please refrain from having sex until labs results, if positive please refrain from having sex until treatment complete and symptoms resolve   If positive for HIV, Syphilis, Chlamydia  gonorrhea or trichomoniasis please notify partner or partners so they may tested as well  Moving forward, it is recommended you use some form of protection against the transmission of sti infections  such as condoms or dental dams with each sexual encounter     In addition: Avoid baths, hot tubs and whirlpool spas.  Don't use scented or harsh soaps Avoid irritants. These include scented tampons and pads. Wipe from front to back after using the toilet. Don't douche. Your vagina doesn't require cleansing other than normal bathing.  Use a condom.  Wear cotton underwear, this fabric absorbs some moisture.

## 2022-02-15 LAB — CERVICOVAGINAL ANCILLARY ONLY
Bacterial Vaginitis (gardnerella): POSITIVE — AB
Candida Glabrata: NEGATIVE
Candida Vaginitis: NEGATIVE
Chlamydia: NEGATIVE
Comment: NEGATIVE
Comment: NEGATIVE
Comment: NEGATIVE
Comment: NEGATIVE
Comment: NEGATIVE
Comment: NORMAL
Neisseria Gonorrhea: NEGATIVE
Trichomonas: NEGATIVE

## 2022-03-31 ENCOUNTER — Encounter: Payer: Medicaid Other | Admitting: Obstetrics and Gynecology

## 2022-07-07 ENCOUNTER — Ambulatory Visit (HOSPITAL_COMMUNITY)
Admission: EM | Admit: 2022-07-07 | Discharge: 2022-07-07 | Disposition: A | Discharge status: Home / Self Care | Payer: Medicaid Other | Attending: Physician Assistant | Admitting: Physician Assistant

## 2022-07-07 ENCOUNTER — Encounter (HOSPITAL_COMMUNITY): Payer: Self-pay | Admitting: *Deleted

## 2022-07-07 DIAGNOSIS — B3731 Acute candidiasis of vulva and vagina: Secondary | ICD-10-CM | POA: Insufficient documentation

## 2022-07-07 LAB — POCT URINALYSIS DIPSTICK, ED / UC
Bilirubin Urine: NEGATIVE
Glucose, UA: NEGATIVE mg/dL
Ketones, ur: 15 mg/dL — AB
Nitrite: NEGATIVE
Protein, ur: NEGATIVE mg/dL
Specific Gravity, Urine: 1.015 (ref 1.005–1.030)
Urobilinogen, UA: 0.2 mg/dL (ref 0.0–1.0)
pH: 6 (ref 5.0–8.0)

## 2022-07-07 MED ORDER — FLUCONAZOLE 150 MG PO TABS: 150.0000 mg | ORAL_TABLET | Freq: Every day | ORAL | 1 refills | Status: AC

## 2022-07-07 NOTE — ED Provider Notes (Signed)
[name]    CSN: [id number] Arrival date & time: 07/07/22  1436      History   Chief Complaint Chief Complaint  Patient presents with   Vaginal Itching    HPI Joyce Hernandez is a 26 y.o. female.   Pt complains of a yeast infection.  Pt complains of vaginal itching and discharge.  Pt reports she has had bv in the past as well  The history is provided by the patient. No language interpreter was used.  Vaginal Itching This is a new problem. The problem occurs constantly. The problem has not changed since onset.Nothing aggravates the symptoms. She has tried nothing for the symptoms.    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 Authorizing Provider  fluconazole (DIFLUCAN) 150 MG tablet Take 1 tablet (150 mg total) by mouth daily. 07/07/22  Yes Fransico Meadow, PA-C  Multiple Vitamin (MULTIVITAMIN) capsule Take 1 capsule by mouth daily.    [provider]  Cetirizine HCl 10 MG CAPS Take 1 capsule (10 mg total) by mouth daily. 09/29/19 01/27/20  Wieters, Hallie C, PA-C  fluticasone (FLONASE) 50 MCG/ACT nasal spray Place 1-2 sprays into both nostrils daily for 7 days. 09/29/19 01/27/20  Wieters, Hallie C, PA-C  Levonorgestrel-Ethinyl Estradiol (AMETHIA) 0.15-0.03 &0.01 MG tablet Take 1 tablet by mouth daily. Patient not taking: Reported on  08/29/2019 07/25/19 09/29/19  Gavin Pound, CNM  omeprazole (PRILOSEC) 20 MG capsule Take 1 capsule (20 mg total) by mouth daily. Patient not taking: Reported on 07/25/2019 03/30/18 07/31/19  Raylene Everts, MD    Family History Family History  Problem Relation Age of Onset   Healthy Mother    Healthy Father     Social History Social History   Tobacco Use   Smoking status: Never   Smokeless tobacco: Never  Vaping Use   Vaping Use: Never used  Substance Use Topics   Alcohol use: Yes    Comment: occasionally   Drug use: No     Allergies   No known allergies   Review of Systems Review of Systems  All other systems reviewed and are negative.    Physical Exam Triage Vital Signs ED Triage Vitals  Enc Vitals Group     BP 07/07/22 1456 128/82     Pulse Rate 07/07/22 1456 70     Resp 07/07/22 1456 18     Temp 07/07/22 1456 98.3 F (36.8 C)     Temp Source 07/07/22 1456 Oral     SpO2 07/07/22 1456 97 %  Weight --      Height --      Head Circumference --      Peak Flow --      Pain Score 07/07/22 1453 0     Pain Loc --      Pain Edu? --      Excl. in GC? --    No data found.  Updated Vital Signs BP 128/82 (BP Location: Right Arm)   Pulse 70   Temp 98.3 F (36.8 C) (Oral)   Resp 18   LMP 06/14/2022 (Exact Date)   SpO2 97%   Visual Acuity Right Eye Distance:   Left Eye Distance:   Bilateral Distance:    Right Eye Near:   Left Eye Near:    Bilateral Near:     Physical Exam Vitals and nursing note reviewed.  Constitutional:      General: She is not in acute distress.    Appearance: She is well-developed.  HENT:     Head: Normocephalic and atraumatic.  Eyes:     Conjunctiva/sclera: Conjunctivae normal.  Cardiovascular:     Rate and Rhythm: Normal rate and regular rhythm.     Heart sounds: No murmur heard. Pulmonary:     Effort: Pulmonary effort is normal. No respiratory distress.     Breath sounds: Normal breath sounds.  Abdominal:      Palpations: Abdomen is soft.     Tenderness: There is no abdominal tenderness.  Musculoskeletal:        General: No swelling.     Cervical back: Neck supple.  Skin:    General: Skin is warm and dry.     Capillary Refill: Capillary refill takes less than 2 seconds.  Neurological:     Mental Status: She is alert.  Psychiatric:        Mood and Affect: Mood normal.      UC Treatments / Results  Labs (all labs ordered are listed, but only abnormal results are displayed) Labs Reviewed  POCT URINALYSIS DIPSTICK, ED / UC - Abnormal; Notable for the following components:      Result Value   Ketones, ur 15 (*)    Hgb urine dipstick TRACE (*)    Leukocytes,Ua LARGE (*)    All other components within normal limits    EKG   Radiology No results found.  Procedures Procedures (including critical care time)  Medications Ordered in UC Medications - No data to display  Initial Impression / Assessment and Plan / UC Course  I have reviewed the triage vital signs and the nursing notes.  Pertinent labs & imaging results that were available during my care of the patient were reviewed by me and considered in my medical decision making (see chart for details).      Final Clinical Impressions(s) / UC Diagnoses   Final diagnoses:  None     Discharge Instructions      Test are pending   ED Prescriptions     Medication Sig Dispense Auth. Provider   fluconazole (DIFLUCAN) 150 MG tablet Take 1 tablet (150 mg total) by mouth daily. 1 tablet Elson Areas, [place]      PDMP not reviewed this encounter. An After Visit Summary was printed and given to the patient.    Elson Areas, [place] 07/07/22 1520

## 2022-07-07 NOTE — Discharge Instructions (Addendum)
Test are pending 

## 2022-07-07 NOTE — ED Triage Notes (Signed)
Pt states she is having vaginal itching x 3 days and some discharge started today.   She is open to STI testing if needed, she does have unprotected sex but no known exposures.

## 2022-07-08 ENCOUNTER — Telehealth: Payer: Self-pay | Admitting: Emergency Medicine

## 2022-07-08 LAB — CERVICOVAGINAL ANCILLARY ONLY
Bacterial Vaginitis (gardnerella): POSITIVE — AB
Candida Glabrata: NEGATIVE
Candida Vaginitis: POSITIVE — AB
Chlamydia: NEGATIVE
Comment: NEGATIVE
Comment: NEGATIVE
Comment: NEGATIVE
Comment: NEGATIVE
Comment: NEGATIVE
Comment: NORMAL
Neisseria Gonorrhea: NEGATIVE
Trichomonas: NEGATIVE

## 2022-07-08 MED ORDER — METRONIDAZOLE 500 MG PO TABS
500.0000 mg | ORAL_TABLET | Freq: Two times a day (BID) | ORAL | 0 refills | Status: AC
Start: 2022-07-08 — End: ?

## 2022-07-11 ENCOUNTER — Telehealth: Payer: Self-pay | Admitting: Family Medicine

## 2022-07-11 DIAGNOSIS — B3731 Acute candidiasis of vulva and vagina: Secondary | ICD-10-CM

## 2022-07-11 NOTE — Progress Notes (Signed)
[place]   Has received several treatments for yeast and is still reporting it. Needs to be seen in person for on going symptoms.

## 2023-11-04 IMAGING — DX DG ABDOMEN 1V
1 series · 1 of 1 positions shown · non-contrast
Comparison: None

CLINICAL DATA: A 25-year-old female presents with history of RIGHT
lower quadrant abdominal pain and bloody stools.

EXAM:
ABDOMEN - 1 VIEW

[abdomen kub]
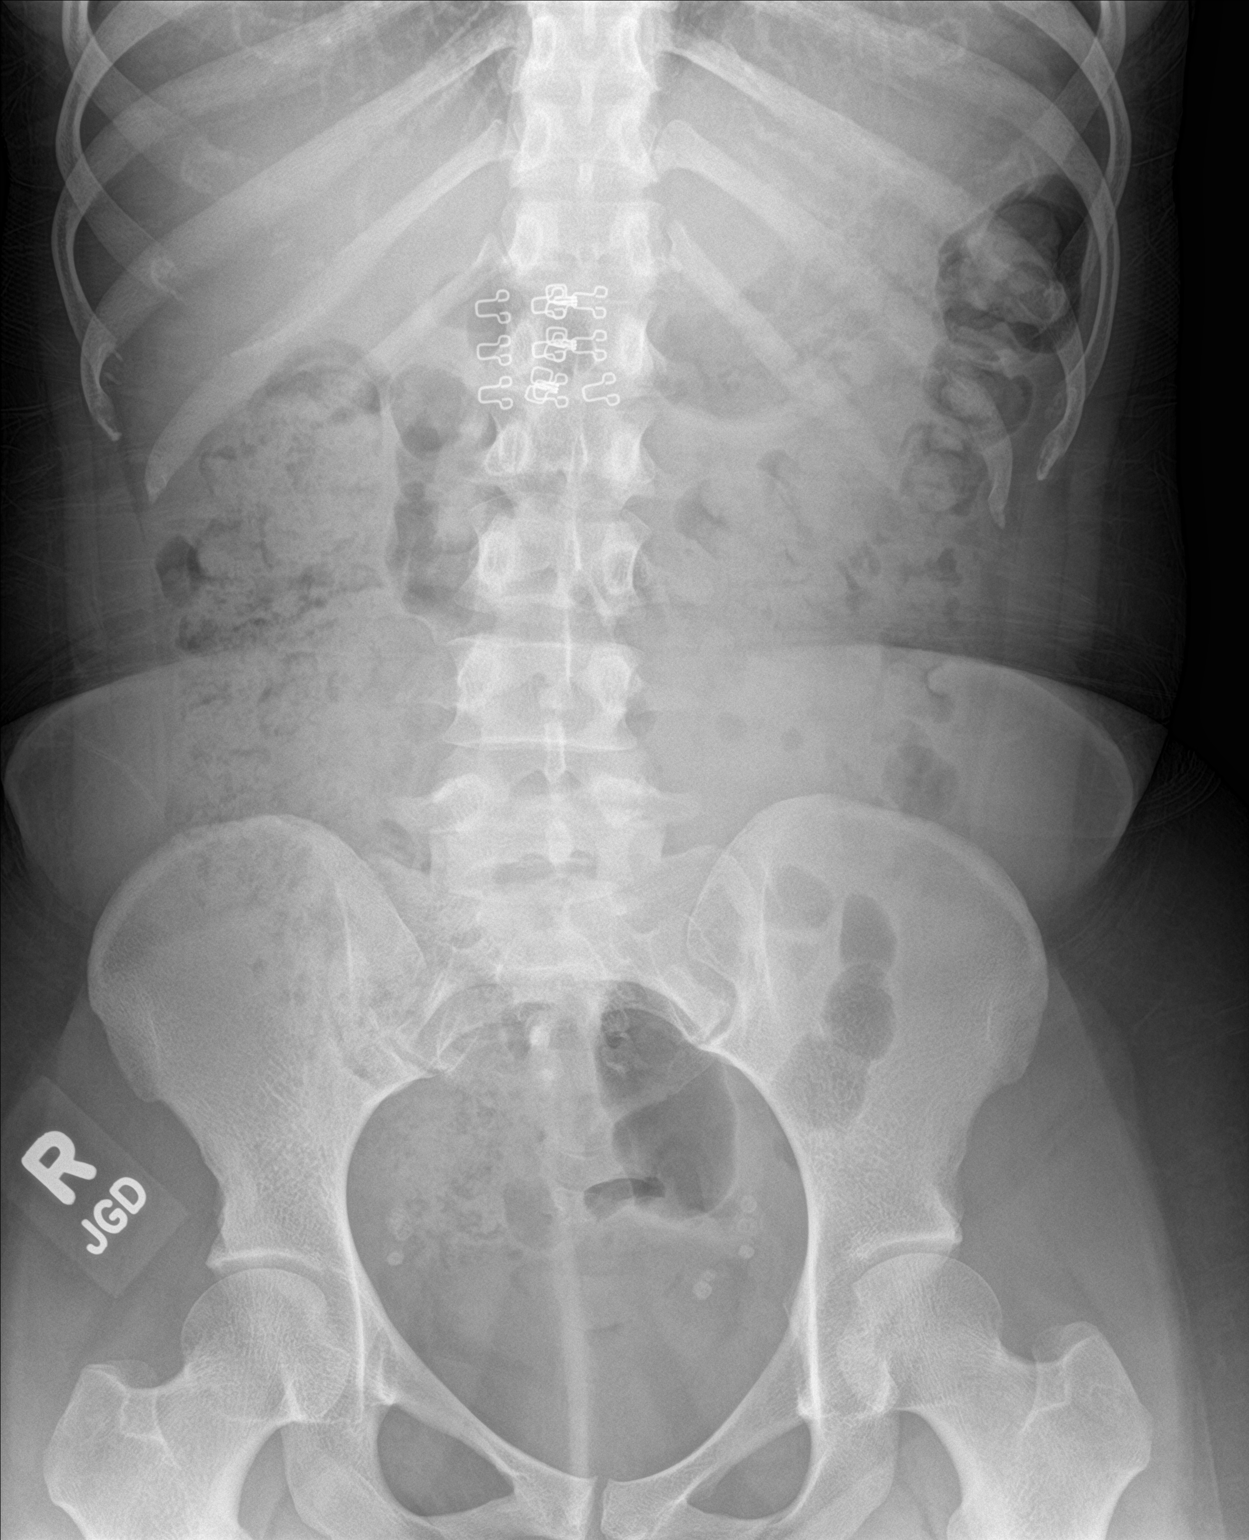

[1 of 1 positions shown; findings below may reference images not displayed]

FINDINGS: Image excludes the RIGHT and LEFT hemidiaphragm.

No signs of small bowel dilation to suggest bowel obstruction.

Large volume stool in the RIGHT hemicolon, ascending colon and
moderate stool in the transverse and proximal descending colon. Gas
seen to the level of the distal sigmoid/rectum.

On limited assessment there is no acute skeletal finding.
Phleboliths are present in the pelvis. Renal contours are obscured
by large volume of stool. Soft tissues are grossly unremarkable.
IMPRESSION: Large volume stool in the RIGHT hemicolon, ascending colon and
moderate stool in the transverse and proximal descending colon. No
signs of bowel obstruction. Query constipation.
